# Patient Record
Sex: Female | Born: 1965 | Race: White | Hispanic: No | Marital: Single | Smoking: Former smoker
Health system: Southern US, Community
[De-identification: ages and names within clinical notes are randomized; demographics above are authoritative.]

## PROBLEM LIST (undated history)

## (undated) DIAGNOSIS — T7840XA Allergy, unspecified, initial encounter: Secondary | ICD-10-CM

## (undated) DIAGNOSIS — E785 Hyperlipidemia, unspecified: Secondary | ICD-10-CM

## (undated) DIAGNOSIS — I10 Essential (primary) hypertension: Secondary | ICD-10-CM

## (undated) DIAGNOSIS — M1990 Unspecified osteoarthritis, unspecified site: Secondary | ICD-10-CM

## (undated) DIAGNOSIS — K219 Gastro-esophageal reflux disease without esophagitis: Secondary | ICD-10-CM

## (undated) DIAGNOSIS — R519 Headache, unspecified: Secondary | ICD-10-CM

## (undated) DIAGNOSIS — U071 COVID-19: Secondary | ICD-10-CM

## (undated) DIAGNOSIS — F419 Anxiety disorder, unspecified: Secondary | ICD-10-CM

## (undated) DIAGNOSIS — E039 Hypothyroidism, unspecified: Secondary | ICD-10-CM

## (undated) DIAGNOSIS — R7303 Prediabetes: Secondary | ICD-10-CM

## (undated) DIAGNOSIS — I8390 Asymptomatic varicose veins of unspecified lower extremity: Secondary | ICD-10-CM

## (undated) HISTORY — PX: TUBAL LIGATION: SHX77

## (undated) HISTORY — PX: CHOLECYSTECTOMY: SHX55

## (undated) HISTORY — DX: Allergy, unspecified, initial encounter: T78.40XA

## (undated) HISTORY — DX: Hyperlipidemia, unspecified: E78.5

## (undated) HISTORY — DX: Asymptomatic varicose veins of unspecified lower extremity: I83.90

---

## 1999-03-11 ENCOUNTER — Other Ambulatory Visit: Admission: RE | Admit: 1999-03-11 | Discharge: 1999-03-11 | Payer: Self-pay | Admitting: Family Medicine

## 2010-10-14 ENCOUNTER — Emergency Department (HOSPITAL_COMMUNITY)
Admission: EM | Admit: 2010-10-14 | Discharge: 2010-10-14 | Payer: Self-pay | Source: Home / Self Care | Admitting: Emergency Medicine

## 2012-04-24 ENCOUNTER — Encounter: Payer: Self-pay | Admitting: Family Medicine

## 2012-04-24 ENCOUNTER — Ambulatory Visit (INDEPENDENT_AMBULATORY_CARE_PROVIDER_SITE_OTHER): Payer: 59 | Admitting: Family Medicine

## 2012-04-24 VITALS — BP 112/80 | HR 60 | Resp 16 | Ht 64.0 in | Wt 221.4 lb

## 2012-04-24 DIAGNOSIS — R0602 Shortness of breath: Secondary | ICD-10-CM

## 2012-04-24 DIAGNOSIS — E669 Obesity, unspecified: Secondary | ICD-10-CM

## 2012-04-24 DIAGNOSIS — M25569 Pain in unspecified knee: Secondary | ICD-10-CM

## 2012-04-24 DIAGNOSIS — Z720 Tobacco use: Secondary | ICD-10-CM

## 2012-04-24 DIAGNOSIS — M7989 Other specified soft tissue disorders: Secondary | ICD-10-CM

## 2012-04-24 DIAGNOSIS — M549 Dorsalgia, unspecified: Secondary | ICD-10-CM | POA: Insufficient documentation

## 2012-04-24 DIAGNOSIS — F17200 Nicotine dependence, unspecified, uncomplicated: Secondary | ICD-10-CM

## 2012-04-24 DIAGNOSIS — J309 Allergic rhinitis, unspecified: Secondary | ICD-10-CM

## 2012-04-24 DIAGNOSIS — K219 Gastro-esophageal reflux disease without esophagitis: Secondary | ICD-10-CM | POA: Insufficient documentation

## 2012-04-24 DIAGNOSIS — J302 Other seasonal allergic rhinitis: Secondary | ICD-10-CM

## 2012-04-24 LAB — COMPREHENSIVE METABOLIC PANEL
AST: 16 U/L (ref 0–37)
Albumin: 4.1 g/dL (ref 3.5–5.2)
Alkaline Phosphatase: 51 U/L (ref 39–117)
CO2: 29 mEq/L (ref 19–32)
Calcium: 9.1 mg/dL (ref 8.4–10.5)
Creat: 0.66 mg/dL (ref 0.50–1.10)
Potassium: 4.4 mEq/L (ref 3.5–5.3)

## 2012-04-24 LAB — CBC WITH DIFFERENTIAL/PLATELET
Basophils Absolute: 0 10*3/uL (ref 0.0–0.1)
Basophils Relative: 0 % (ref 0–1)
Hemoglobin: 12 g/dL (ref 12.0–15.0)
MCH: 29.3 pg (ref 26.0–34.0)
MCHC: 33.4 g/dL (ref 30.0–36.0)
Monocytes Absolute: 0.4 10*3/uL (ref 0.1–1.0)
Neutro Abs: 2.4 10*3/uL (ref 1.7–7.7)
Neutrophils Relative %: 50 % (ref 43–77)
WBC: 5 10*3/uL (ref 4.0–10.5)

## 2012-04-24 LAB — LIPID PANEL
Cholesterol: 231 mg/dL — ABNORMAL HIGH (ref 0–200)
HDL: 48 mg/dL (ref >39)
Total CHOL/HDL Ratio: 4.8 Ratio
Triglycerides: 224 mg/dL — ABNORMAL HIGH (ref <150)

## 2012-04-24 MED ORDER — RANITIDINE HCL 150 MG PO TABS: 150.0000 mg | ORAL_TABLET | Freq: Every day | ORAL | Status: DC

## 2012-04-24 NOTE — Patient Instructions (Signed)
Try the zantac at bedtime Get the labs done we will call with results Try the zyrtec Elevate legs F/U 6 weeks for physical exam/PAP Smear Congratulations on the smoking

## 2012-04-24 NOTE — Progress Notes (Signed)
  Subjective:    Patient ID: Hannah Allen, female    DOB: September 17, 1966, 46 y.o.   MRN: [id number]  HPI Patient presents to establish care. No previous primary care provider. She has not seen a physician in many years. She does follow with the chiropractic here in town for her back pain. 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 She has no past medical history besides the back pain. Today she is concerned about leg swelling which is increased of the past few weeks she recently started walking and has had some left knee pain as well. She has gained 22 pounds since she quit smoking 6 months ago. She's never had a mammogram.  When she began walking she felt short of breath and felt like she couldn't breathe out of her nose and she may faint.  She was evaluated at [facility] a few years ago for chest pain and was admitted overnight with testing. All of this was negative and she was told she has acid reflux. She continues to have problems with her acid reflux however does not take any medications.   Review of Systems - per above   GEN- denies fatigue, fever, weight loss,weakness, recent illness HEENT- denies eye drainage, change in vision, nasal discharge, +sore throat on and off CVS- denies chest pain, palpitations RESP- + SOB, cough, wheeze ABD- denies N/V, change in stools, abd pain GU- denies dysuria, hematuria, dribbling, incontinence MSK- + joint pain, muscle aches, injury Neuro- denies headache, dizziness, syncope, seizure activity      Objective:   Physical Exam GEN- NAD, alert and oriented x3 HEENT- PERRL, EOMI, non injected sclera, pink conjunctiva, MMM, oropharynx clear, TM Clear bilat, uvula hangs toward right, no exudates, turbinates inflamed clear mucous seen, no sinus tenderness Neck- Supple, no thryomegaly CVS- RRR, no murmur RESP-CTAB ABD-NABS,soft, NT,ND EXT- +pedal  Edema, varicose veins, spider veins    Right knee- normal inspection, normal ROM, left  knee no appreciable effusion,no boney abnormality, mild anterior swelling below the patella- varicose vein seen here, NT, normal ROM Pulses- Radial, DP- 2+ Psych- normal affect and Mood   EKG-  NSR, no ST changes         Assessment & Plan:

## 2012-04-24 NOTE — Assessment & Plan Note (Signed)
Trial of zantac at bedtime 

## 2012-04-24 NOTE — Assessment & Plan Note (Signed)
This is likely due to her obesity and decreased conditioning. Her EKG was reassuring. Lab work to be done. I will consider echocardiogram however she does not have very much edema today. She seems to be very anxious regarding this. Reassure her she can continue to walk

## 2012-04-24 NOTE — Assessment & Plan Note (Signed)
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

## 2012-04-24 NOTE — Assessment & Plan Note (Signed)
She has some mild pedal edema she also has varicose veins should also be contributing to her swelling when they burst such as the one below her knee. She has TED hose available she can wear these while at work she also has appropriate shoes. Advised to elevate feet when she is not working. This does not appear to be cardiac at this time.

## 2012-04-24 NOTE — Assessment & Plan Note (Signed)
Discuss her exercise. She started out trying to do 3 miles at a time. Advised her to cut back and she is getting symptomatic with shortness of breath and feeling lightheaded and fatigued. I think she is overdue and initially. She does need to work on weight loss since she has quit smoking and has increased her snacking

## 2012-04-24 NOTE — Assessment & Plan Note (Signed)
Trial of OTC allergy med for runny nose and occasional sore throat

## 2012-04-24 NOTE — Assessment & Plan Note (Signed)
Acute left knee pain since she began walking. The knee appears to be intact. Her weight is also contributing to pain.

## 2012-04-25 ENCOUNTER — Encounter: Payer: Self-pay | Admitting: Family Medicine

## 2012-04-25 DIAGNOSIS — E782 Mixed hyperlipidemia: Secondary | ICD-10-CM | POA: Insufficient documentation

## 2012-05-01 ENCOUNTER — Ambulatory Visit (HOSPITAL_COMMUNITY)
Admission: RE | Admit: 2012-05-01 | Discharge: 2012-05-01 | Disposition: A | Discharge status: Home / Self Care | Payer: 59 | Source: Ambulatory Visit | Attending: Family Medicine | Admitting: Family Medicine

## 2012-05-01 ENCOUNTER — Encounter: Payer: Self-pay | Admitting: Family Medicine

## 2012-05-01 ENCOUNTER — Ambulatory Visit (INDEPENDENT_AMBULATORY_CARE_PROVIDER_SITE_OTHER): Payer: 59 | Admitting: Family Medicine

## 2012-05-01 VITALS — BP 118/76 | HR 75 | Resp 18 | Ht 64.0 in | Wt 219.1 lb

## 2012-05-01 DIAGNOSIS — M25473 Effusion, unspecified ankle: Secondary | ICD-10-CM | POA: Insufficient documentation

## 2012-05-01 DIAGNOSIS — M25569 Pain in unspecified knee: Secondary | ICD-10-CM | POA: Insufficient documentation

## 2012-05-01 DIAGNOSIS — M7989 Other specified soft tissue disorders: Secondary | ICD-10-CM

## 2012-05-01 DIAGNOSIS — M549 Dorsalgia, unspecified: Secondary | ICD-10-CM

## 2012-05-01 DIAGNOSIS — M25476 Effusion, unspecified foot: Secondary | ICD-10-CM | POA: Insufficient documentation

## 2012-05-01 DIAGNOSIS — M25579 Pain in unspecified ankle and joints of unspecified foot: Secondary | ICD-10-CM | POA: Insufficient documentation

## 2012-05-01 MED ORDER — FUROSEMIDE 20 MG PO TABS: 20.0000 mg | ORAL_TABLET | Freq: Every day | ORAL | Status: DC

## 2012-05-01 MED ORDER — MELOXICAM 7.5 MG PO TABS: 7.5000 mg | ORAL_TABLET | Freq: Every day | ORAL | Status: DC

## 2012-05-01 MED ORDER — TRAMADOL HCL 50 MG PO TABS: 50.0000 mg | ORAL_TABLET | Freq: Four times a day (QID) | ORAL | Status: DC | PRN

## 2012-05-01 NOTE — Patient Instructions (Addendum)
Get the xrays done of your back, knee and ankle Take the anti-inflammatory once a day Use the ultram as needed Try an ASO brace for the ankle Lasix sent for swelling as needed  Keep previous f/u appt

## 2012-05-02 DIAGNOSIS — M25579 Pain in unspecified ankle and joints of unspecified foot: Secondary | ICD-10-CM | POA: Insufficient documentation

## 2012-05-02 NOTE — Assessment & Plan Note (Signed)
Will obtain x-ray of knee, I think the inferior patella swelling is still related to varicose vein.  X-ray of knee normal with exception of mild narrowing

## 2012-05-02 NOTE — Progress Notes (Signed)
  Subjective:    Patient ID: Hannah Allen, female    DOB: 1966/08/02, 46 y.o.   MRN: [id number]  HPI Pt here with increased leg swelling and ankle, back and knee pain. She worked as Scientist, clinical (histocompatibility and immunogenetics) and was up on feet entire weekend, her left ankle began to swell and is painful to walk on. She also has more pain in left knee. Using advil but not helping with pain. No injury   Review of Systems   GEN- denies fatigue, fever, weight loss,weakness, recent illness CVS- denies chest pain, palpitations RESP- denies SOB, cough, wheeze MSK- + joint pain, muscle aches, injury Neuro- denies headache, dizziness, syncope, seizure activity      Objective:   Physical Exam GEN-NAD, alert and oriented x 3 CVS-RRR, no murmur, No JVD RESP- CTAB EXT- +pedal  Edema   L > R, varicose veins, spider veins    Left knee- , normal ROM, swelling below patella, mild TTP, left knee no appreciable effusion- varicose vein seen here,  Left Ankle- decreased ROM with swelling, TTP lateral malleous, ligaments in tact, able to bear weight Pulses- Radial, DP- 2+ Back- spine NT, neg SLR,  Neuro-motor equal bilat lower ext, sensation in tact      Assessment & Plan:

## 2012-05-02 NOTE — Assessment & Plan Note (Addendum)
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

## 2012-05-02 NOTE — Assessment & Plan Note (Signed)
Left ankle pain acute in nature, x-ray obtained which was negative for bony pathology but swelling noted, will have her wear ASO for stability as she feels uncomfortable, per above for swelling

## 2012-05-02 NOTE — Assessment & Plan Note (Signed)
Swelling more on side of varicose veins, but significantly more than previous exam, elevate feet, lasix given as she states they swell even more to be used as needed, consider vascular and vein referral

## 2012-05-07 ENCOUNTER — Telehealth: Payer: Self-pay | Admitting: Family Medicine

## 2012-05-07 NOTE — Telephone Encounter (Signed)
Called back with results. No answer

## 2012-05-07 NOTE — Telephone Encounter (Signed)
Pt aware.

## 2012-05-31 ENCOUNTER — Encounter: Payer: Self-pay | Admitting: Family Medicine

## 2012-06-02 ENCOUNTER — Encounter (HOSPITAL_COMMUNITY): Payer: Self-pay | Admitting: *Deleted

## 2012-06-02 ENCOUNTER — Emergency Department (HOSPITAL_COMMUNITY): Payer: 59

## 2012-06-02 ENCOUNTER — Emergency Department (HOSPITAL_COMMUNITY)
Admission: EM | Admit: 2012-06-02 | Discharge: 2012-06-03 | Disposition: A | Discharge status: Home / Self Care | Payer: 59 | Attending: Emergency Medicine | Admitting: Emergency Medicine

## 2012-06-02 DIAGNOSIS — Z87891 Personal history of nicotine dependence: Secondary | ICD-10-CM | POA: Insufficient documentation

## 2012-06-02 DIAGNOSIS — F411 Generalized anxiety disorder: Secondary | ICD-10-CM | POA: Insufficient documentation

## 2012-06-02 DIAGNOSIS — F419 Anxiety disorder, unspecified: Secondary | ICD-10-CM

## 2012-06-02 HISTORY — DX: Anxiety disorder, unspecified: F41.9

## 2012-06-02 LAB — CBC WITH DIFFERENTIAL/PLATELET
Basophils Relative: 1 % (ref 0–1)
Lymphocytes Relative: 45 % (ref 12–46)
Lymphs Abs: 2.8 10*3/uL (ref 0.7–4.0)
MCH: 29.1 pg (ref 26.0–34.0)
Monocytes Absolute: 0.5 10*3/uL (ref 0.1–1.0)
Monocytes Relative: 8 % (ref 3–12)
Neutrophils Relative %: 44 % (ref 43–77)
Platelets: 246 10*3/uL (ref 150–400)
RBC: 4.12 MIL/uL (ref 3.87–5.11)
WBC: 6.2 10*3/uL (ref 4.0–10.5)

## 2012-06-02 LAB — D-DIMER, QUANTITATIVE: D-Dimer, Quant: 0.43 ug/mL-FEU (ref 0.00–0.48)

## 2012-06-02 LAB — BASIC METABOLIC PANEL
BUN: 21 mg/dL (ref 6–23)
CO2: 27 mEq/L (ref 19–32)
Chloride: 100 mEq/L (ref 96–112)
GFR calc Af Amer: >90 mL/min (ref >90)
Glucose, Bld: 92 mg/dL (ref 70–99)

## 2012-06-02 LAB — TROPONIN I: Troponin I: <0.3 ng/mL (ref <0.30)

## 2012-06-02 NOTE — ED Notes (Signed)
Patient aware of need for urine specimen  - unable to void at present

## 2012-06-02 NOTE — ED Provider Notes (Signed)
History   This chart was scribed for [employer]. Rubin Payor, MD by Charolett Bumpers . The patient was seen in room APA12/APA12. Patient's care was started at 2034.    CSN: [id number]  Arrival date & time 06/02/12  2019   First MD Initiated Contact with Patient 06/02/12 2034      Chief Complaint  Patient presents with  . Panic Attack    (Consider location/radiation/quality/duration/timing/severity/associated sxs/prior treatment) HPI Hannah Allen is a 46 y.o. female who presents to the Emergency Department complaining of constant, moderate chest pain with an onset of earlier today approximately 2 hours PTA. Pt reports associated dizziness, SOB, left leg swelling, and cough. Pt describes her chest pain as pressure and states it feels "heavy". Pt reports a h/o similar symptoms and states that today's symptoms feel similar. Pt reports a h/o anxiety and panic attacks. Pt denies any fevers, n/v, dysuria, or increased frequency. Pt reports 6 months of smoking cessation and recent weight gain. Pt denies any drug abuse. Pt denies any h/o DVT or heart problems.   Past Medical History  Diagnosis Date  . Anxiety     Past Surgical History  Procedure Date  . Cholecystectomy   . Tubal ligation     Family History  Problem Relation Age of Onset  . Heart disease Maternal Grandmother   . Diabetes Maternal Grandmother     History  Substance Use Topics  . Smoking status: Former Games developer  . Smokeless tobacco: Not on file   Comment: quit 6 months  . Alcohol Use: No    OB History    Grav Para Term Preterm Abortions TAB SAB Ect Mult Living                  Review of Systems  Constitutional: Negative for fever.  Respiratory: Positive for cough and shortness of breath.   Cardiovascular: Positive for chest pain and leg swelling.  Gastrointestinal: Negative for nausea and vomiting.  Genitourinary: Negative for dysuria and frequency.  Neurological: Positive for dizziness.  All other  systems reviewed and are negative.    Allergies  Review of patient's allergies indicates no known allergies.  Home Medications   Current Outpatient Rx  Name Route Sig Dispense Refill  . ASPIRIN EC 81 MG PO TBEC Oral Take 81 mg by mouth daily.    . FUROSEMIDE 20 MG PO TABS Oral Take 20 mg by mouth daily as needed. For fluid retention    . IBUPROFEN 200 MG PO TABS Oral Take 800 mg by mouth as needed.    Marland Kitchen LORATADINE 10 MG PO TABS Oral Take 10 mg by mouth daily.    Marland Kitchen FISH OIL 1000 MG PO CAPS Oral Take 1,000 mg by mouth 2 (two) times daily.     Marland Kitchen RANITIDINE HCL 150 MG PO TABS Oral Take 150 mg by mouth daily.    . TRAMADOL HCL 50 MG PO TABS Oral Take 1 tablet (50 mg total) by mouth every 6 (six) hours as needed for pain. 30 tablet 1    BP 146/78  Pulse 61  Temp 98.4 F (36.9 C) (Oral)  Resp 18  Ht 5\' 4"  (1.626 m)  Wt 226 lb (102.513 kg)  BMI 38.79 kg/m2  SpO2 100%  Physical Exam  Nursing note and vitals reviewed. Constitutional: She is oriented to person, place, and time. She appears well-developed and well-nourished. No distress.  HENT:  Head: Normocephalic and atraumatic.  Eyes: EOM are normal. Pupils are equal, round, and  reactive to light.  Neck: Neck supple. No tracheal deviation present.  Cardiovascular: Normal rate and normal heart sounds.   No murmur heard. Pulmonary/Chest: Effort normal and breath sounds normal. No respiratory distress. She has no wheezes.  Abdominal: Soft. Bowel sounds are normal. She exhibits no distension. There is no tenderness. There is no rebound and no guarding.  Musculoskeletal: Normal range of motion. She exhibits edema.       Mild edema of lower extremities, worse on the left.   Neurological: She is alert and oriented to person, place, and time.  Skin: Skin is warm and dry.       Varicose veins on left leg.   Psychiatric: She has a normal mood and affect. Her behavior is normal.    ED Course  Procedures (including critical care  time)  DIAGNOSTIC STUDIES: Oxygen Saturation is 99% on room air, normal by my interpretation.    COORDINATION OF CARE:  20:40-Discussed planned course of treatment with the patient, who is agreeable at this time.   Results for orders placed during the hospital encounter of 06/02/12  CBC WITH DIFFERENTIAL      Component Value Range   WBC 6.2  4.0 - 10.5 K/uL   RBC 4.12  3.87 - 5.11 MIL/uL   Hemoglobin 12.0  12.0 - 15.0 g/dL   HCT 16.1  09.6 - 04.5 %   MCV 87.9  78.0 - 100.0 fL   MCH 29.1  26.0 - 34.0 pg   MCHC 33.1  30.0 - 36.0 g/dL   RDW 40.9  81.1 - 91.4 %   Platelets 246  150 - 400 K/uL   Neutrophils Relative 44  43 - 77 %   Neutro Abs 2.7  1.7 - 7.7 K/uL   Lymphocytes Relative 45  12 - 46 %   Lymphs Abs 2.8  0.7 - 4.0 K/uL   Monocytes Relative 8  3 - 12 %   Monocytes Absolute 0.5  0.1 - 1.0 K/uL   Eosinophils Relative 2  0 - 5 %   Eosinophils Absolute 0.1  0.0 - 0.7 K/uL   Basophils Relative 1  0 - 1 %   Basophils Absolute 0.0  0.0 - 0.1 K/uL  TROPONIN I      Component Value Range   Troponin I <0.30  <0.30 ng/mL  BASIC METABOLIC PANEL      Component Value Range   Sodium 135  135 - 145 mEq/L   Potassium 3.8  3.5 - 5.1 mEq/L   Chloride 100  96 - 112 mEq/L   CO2 27  19 - 32 mEq/L   Glucose, Bld 92  70 - 99 mg/dL   BUN 21  6 - 23 mg/dL   Creatinine, Ser 7.82  0.50 - 1.10 mg/dL   Calcium 9.8  8.4 - 95.6 mg/dL   GFR calc non Af Amer >90  >90 mL/min   GFR calc Af Amer >90  >90 mL/min  D-DIMER, QUANTITATIVE      Component Value Range   D-Dimer, Quant 0.43  0.00 - 0.48 ug/mL-FEU   Dg Chest 2 View  06/02/2012  *RADIOLOGY REPORT*  Clinical Data: 46 year old female with chest pain.  CHEST - 2 VIEW  Comparison: None  Findings: The cardiomediastinal silhouette is unremarkable. There is no evidence of focal airspace disease, pulmonary edema, suspicious pulmonary nodule/mass, pleural effusion, or pneumothorax. No acute bony abnormalities are identified.  IMPRESSION: No evidence  of active cardiopulmonary disease.  Original Report Authenticated By: JEFFREY  Brand Males, M.D.      1. Anxiety      Date: 06/02/2012  Rate: 60  Rhythm: normal sinus rhythm  QRS Axis: normal  Intervals: normal  ST/T Wave abnormalities: normal  Conduction Disutrbances: none  Narrative Interpretation: unremarkable      MDM  Patient presents with dizziness shortness of breath and chest heaviness. She has a history of panic attacks and thinks this could of been one of them. EKG is normal. Lab work is reassuring. She does have one leg more swollen than the other, however she has a negative d-dimer. Patient will be discharged home to followup with her primary care Dr. I doubt severe cardiac cause of the symptoms.    I personally performed the services described in this documentation, which was scribed in my presence. The recorded information has been reviewed and considered.       Juliet Rude. Rubin Payor, MD 06/02/12 2351

## 2012-06-02 NOTE — ED Notes (Addendum)
Pt reports getting dizzy, and having some SOB earlier, states chest is feeling "heavy". Reports symptoms began a little over 2 hours ago. Denies any nausea.  Reports that she does have history of panic attacks, and believes she may be having one.

## 2012-06-07 ENCOUNTER — Ambulatory Visit (INDEPENDENT_AMBULATORY_CARE_PROVIDER_SITE_OTHER): Payer: 59 | Admitting: Family Medicine

## 2012-06-07 ENCOUNTER — Encounter: Payer: Self-pay | Admitting: Family Medicine

## 2012-06-07 VITALS — BP 140/82 | HR 72 | Resp 16 | Ht 64.0 in | Wt 219.0 lb

## 2012-06-07 DIAGNOSIS — I8390 Asymptomatic varicose veins of unspecified lower extremity: Secondary | ICD-10-CM

## 2012-06-07 DIAGNOSIS — M7989 Other specified soft tissue disorders: Secondary | ICD-10-CM

## 2012-06-07 DIAGNOSIS — F419 Anxiety disorder, unspecified: Secondary | ICD-10-CM

## 2012-06-07 DIAGNOSIS — J329 Chronic sinusitis, unspecified: Secondary | ICD-10-CM

## 2012-06-07 DIAGNOSIS — IMO0001 Reserved for inherently not codable concepts without codable children: Secondary | ICD-10-CM

## 2012-06-07 DIAGNOSIS — F411 Generalized anxiety disorder: Secondary | ICD-10-CM

## 2012-06-07 DIAGNOSIS — R030 Elevated blood-pressure reading, without diagnosis of hypertension: Secondary | ICD-10-CM

## 2012-06-07 MED ORDER — FLUTICASONE PROPIONATE 50 MCG/ACT NA SUSP: 2.0000 | Freq: Every day | NASAL | Status: DC

## 2012-06-07 MED ORDER — AMOXICILLIN-POT CLAVULANATE 875-125 MG PO TABS: 1.0000 | ORAL_TABLET | Freq: Two times a day (BID) | ORAL | Status: AC

## 2012-06-07 NOTE — Progress Notes (Signed)
  Subjective:    Patient ID: Hannah Allen, female    DOB: [birth year], 46 y.o.   MRN: [id number]  HPI Pt here to f/u ER visit for chest pain, she is also having persistent sore throat and feels sinus are clogged x 4 weeks. Feels something in her ears She is concerned about her leg swelling and veins on her legs  ER- D dimer, CE, CXR, EKG, labs- unremarkable, diagnosed with possible anxiety attack   Review of Systems - per above   GEN- denies fatigue, fever, weight loss,weakness, recent illness HEENT- denies eye drainage, change in vision,+ nasal discharge, CVS- denies chest pain, palpitations RESP- denies SOB, cough, wheeze ABD- denies N/V, change in stools, abd pain GU- denies dysuria, hematuria, dribbling, incontinence MSK- + joint pain, muscle aches, injury Neuro- denies headache, dizziness, syncope, seizure activity       Objective:   Physical Exam GEN-NAD, alert and oriented x 3  HEENT-PERRL EOMI, non icteric, pink conjunctiva, TM clear bialt no effusion, Left sinus tenderness, nares mild turbinate hypertrophy, injected oropharynx,  Neck- shotty anterior LAD CVS-RRR, no murmur,  RESP- CTAB EXT- +pedal  Edema   L > R, varicose veins, spider veins  Pulses- Radial, DP- 2+ Psych- anxious when discussing her multiple concerns, not overly depressed, no hallucinations       Assessment & Plan:

## 2012-06-07 NOTE — Patient Instructions (Signed)
You will be referred to vascular and vein specialist in [place] Take antibiotics as prescribed Nose spray given  Schedule for CPE with PAP Smear- End of August Schedule Mammogram- [facility]

## 2012-06-10 DIAGNOSIS — I8390 Asymptomatic varicose veins of unspecified lower extremity: Secondary | ICD-10-CM | POA: Insufficient documentation

## 2012-06-10 DIAGNOSIS — R030 Elevated blood-pressure reading, without diagnosis of hypertension: Secondary | ICD-10-CM | POA: Insufficient documentation

## 2012-06-10 DIAGNOSIS — F419 Anxiety disorder, unspecified: Secondary | ICD-10-CM | POA: Insufficient documentation

## 2012-06-10 DIAGNOSIS — J329 Chronic sinusitis, unspecified: Secondary | ICD-10-CM | POA: Insufficient documentation

## 2012-06-10 NOTE — Assessment & Plan Note (Signed)
She is anxious regarding her health though she had not sought care in many years, given reassurance, mild anxiety no meds at this time

## 2012-06-10 NOTE — Assessment & Plan Note (Signed)
Refer to vein and vascular, with leg swelling and pain L >R

## 2012-06-10 NOTE — Assessment & Plan Note (Signed)
Prolonged symptoms, treat with nasal spray and antibiotics

## 2012-06-10 NOTE — Assessment & Plan Note (Signed)
Will monitor for now

## 2012-06-14 ENCOUNTER — Other Ambulatory Visit: Payer: Self-pay

## 2012-06-14 DIAGNOSIS — M7989 Other specified soft tissue disorders: Secondary | ICD-10-CM

## 2012-06-29 ENCOUNTER — Ambulatory Visit (INDEPENDENT_AMBULATORY_CARE_PROVIDER_SITE_OTHER): Payer: 59 | Admitting: Family Medicine

## 2012-06-29 ENCOUNTER — Other Ambulatory Visit (HOSPITAL_COMMUNITY)
Admission: RE | Admit: 2012-06-29 | Discharge: 2012-06-29 | Disposition: A | Discharge status: Home / Self Care | Payer: 59 | Source: Ambulatory Visit | Attending: Family Medicine | Admitting: Family Medicine

## 2012-06-29 ENCOUNTER — Encounter: Payer: Self-pay | Admitting: Family Medicine

## 2012-06-29 VITALS — BP 140/76 | HR 70 | Resp 18 | Ht 64.0 in | Wt 220.0 lb

## 2012-06-29 DIAGNOSIS — Z01419 Encounter for gynecological examination (general) (routine) without abnormal findings: Secondary | ICD-10-CM | POA: Insufficient documentation

## 2012-06-29 DIAGNOSIS — E785 Hyperlipidemia, unspecified: Secondary | ICD-10-CM

## 2012-06-29 DIAGNOSIS — Z1211 Encounter for screening for malignant neoplasm of colon: Secondary | ICD-10-CM

## 2012-06-29 DIAGNOSIS — Z23 Encounter for immunization: Secondary | ICD-10-CM

## 2012-06-29 LAB — POC HEMOCCULT BLD/STL (OFFICE/1-CARD/DIAGNOSTIC): Fecal Occult Blood, POC: NEGATIVE

## 2012-06-29 NOTE — Progress Notes (Signed)
  Subjective:    Patient ID: Hannah Allen, female    DOB: 1965/11/13, 46 y.o.   MRN: [id number]  HPI Pt presents for GYN exam. No concerns Due for TDAP Last exam > 5 years ago  Due for Mammogram  Medications Reviewed   Review of Systems  GEN- denies fatigue, fever, weight loss,weakness, recent illness HEENT- denies eye drainage, change in vision, nasal discharge, CVS- denies chest pain, palpitations RESP- denies SOB, cough, wheeze ABD- denies N/V, change in stools, abd pain GU- denies dysuria, hematuria, dribbling, incontinence MSK- denies joint pain, muscle aches, injury Neuro- denies headache, dizziness, syncope, seizure activity      Objective:   Physical Exam GEN- NAD, alert and oriented x3 HEENT- PERRL, EOMI, non injected sclera, pink conjunctiva, MMM, oropharynx clear, TM clear bilat  Neck- supple, no LAD  CVS-RRR, no mumur RESP-CTAB ABD-NABS,soft,NT,ND  Breast- normal symmetry, no nipple inversion,no nipple drainage, no nodules or lumps felt Nodes- no axillary nodes GU- normal external genitalia, mild erythema over labia minora, vaginal mucosa pink and moist, cervix visualized no growth, no blood form os, minimal thin clear discharge, no CMT, adenexa not palpable, uterus normal size, urethra normal, no bladder prolapse Rectum- normal tone, soft stool, FOBT negative         Assessment & Plan:    GYN Exam- PAP Smear done, send for Mammogram          TDAP given

## 2012-06-29 NOTE — Patient Instructions (Addendum)
Call and schedule your Mammogram at [facility] Tetanus given today  I recommend eye visit once a year I recommend dental visit every 6 months Goal is to  Exercise 30 minutes 5 days a week We will send a letter with PAP results  Get the cholesterol rechecked in October - 1st week  F/U 6 months

## 2012-08-09 ENCOUNTER — Encounter: Payer: Self-pay | Admitting: Vascular Surgery

## 2012-08-10 ENCOUNTER — Encounter: Payer: Self-pay | Admitting: Vascular Surgery

## 2012-08-10 ENCOUNTER — Ambulatory Visit (INDEPENDENT_AMBULATORY_CARE_PROVIDER_SITE_OTHER): Payer: 59 | Admitting: Vascular Surgery

## 2012-08-10 ENCOUNTER — Encounter (INDEPENDENT_AMBULATORY_CARE_PROVIDER_SITE_OTHER): Payer: 59 | Admitting: *Deleted

## 2012-08-10 VITALS — BP 142/90 | HR 65 | Resp 18 | Ht 64.0 in | Wt 223.0 lb

## 2012-08-10 DIAGNOSIS — I83893 Varicose veins of bilateral lower extremities with other complications: Secondary | ICD-10-CM

## 2012-08-10 DIAGNOSIS — I872 Venous insufficiency (chronic) (peripheral): Secondary | ICD-10-CM

## 2012-08-10 NOTE — Progress Notes (Signed)
[facility] [place]  Referred by:  Salley Scarlet, MD [address] [place], [place] [address]  Reason for referral: Painful B leg  History of Present Illness  Hannah Allen is a 46 y.o. (08-25-1966) female who presents with chief complaint: L > R  leg.  Patient notes, onset of varicose veins in both legs years ago but increased pain in the L>R lower leg over the last few months, associated with extended standing.  The patient has had no history of DVT, prior pregnancies , known history of varicose vein, no history of venous stasis ulcers, no history of  Lymphedema and no history of skin changes in lower legs.  There is no family history of venous disorders.  The patient has never used compression stockings in the past.  Past Medical History  Diagnosis Date  . Anxiety   . Varicose veins   . Allergy   . Hyperlipidemia     Past Surgical History  Procedure Date  . Cholecystectomy   . Tubal ligation     History   Social History  . Marital Status: Single    Spouse Name: N/A    Number of Children: N/A  . Years of Education: N/A   Occupational History  . Not on file.   Social History Main Topics  . Smoking status: Former Smoker    Types: Cigarettes    Quit date: 08/11/2011  . Smokeless tobacco: Never Used   Comment: quit 6 months  . Alcohol Use: No  . Drug Use: No  . Sexually Active: Not on file   Other Topics Concern  . Not on file   Social History Narrative  . No narrative on file    Family History  Problem Relation Age of Onset  . Heart disease Maternal Grandmother   . Diabetes Maternal Grandmother     Current Outpatient Prescriptions on File Prior to Visit  Medication Sig Dispense Refill  . aspirin EC 81 MG tablet Take 81 mg by mouth daily.      . fluticasone (FLONASE) 50 MCG/ACT nasal spray Place 2 sprays into the nose daily.  16 g  2  . furosemide (LASIX) 20 MG tablet Take 20 mg by mouth daily as needed. For fluid retention       . ibuprofen (ADVIL,MOTRIN) 200 MG tablet Take 800 mg by mouth as needed.      . ranitidine (ZANTAC) 150 MG tablet Take 150 mg by mouth daily.      Marland Kitchen loratadine (CLARITIN) 10 MG tablet Take 10 mg by mouth daily.      . Omega-3 Fatty Acids (FISH OIL) 1000 MG CAPS Take 1,000 mg by mouth 2 (two) times daily.       . traMADol (ULTRAM) 50 MG tablet Take 1 tablet (50 mg total) by mouth every 6 (six) hours as needed for pain.  30 tablet  1    No Known Allergies  REVIEW OF SYSTEMS:  (Positives checked otherwise negative)  CARDIOVASCULAR:  [ ]  chest pain, [ ]  chest pressure, [ ]  palpitations, [ ]  shortness of breath when laying flat, [ ]  shortness of breath with exertion,   [x]  pain in feet when walking, [ ]  pain in feet when laying flat, [ ]  history of blood clot in veins (DVT), [ ]  history of phlebitis, [ ]  swelling in legs, [ ]  varicose veins  PULMONARY:  [ ]  productive cough, [ ]  asthma, [ ]  wheezing  NEUROLOGIC:  [x]   weakness in arms or legs, [x]  numbness in arms or legs, [ ]  difficulty speaking or slurred speech, [ ]  temporary loss of vision in one eye, [ ]  dizziness  HEMATOLOGIC:  [ ]  bleeding problems, [ ]  problems with blood clotting too easily  MUSCULOSKEL:  [ ]  joint pain, [ ]  joint swelling  GASTROINTEST:  [ ]   Vomiting blood, [ ]   Blood in stool     GENITOURINARY:  [ ]   Burning with urination, [ ]   Blood in urine  PSYCHIATRIC:  [ ]  history of major depression  INTEGUMENTARY:  [ ]  rashes, [ ]  ulcers  CONSTITUTIONAL:  [ ]  fever, [ ]  chills  Physical Examination  Filed Vitals:   08/10/12 1433  BP: 142/90  Pulse: 65  Resp: 18  Height: 5\' 4"  (1.626 m)  Weight: 223 lb (101.152 kg)   Body mass index is 38.28 kg/(m^2).  General: A&O x 3, WD, obese  Head: /AT  Ear/Nose/Throat: Hearing grossly intact, nares w/o erythema or drainage, oropharynx w/o Erythema/Exudate  Eyes: PERRLA, EOMI  Neck: Supple, no nuchal rigidity, no palpable LAD  Pulmonary: Sym exp, good air  movt, CTAB, no rales, rhonchi, & wheezing  Cardiac: RRR, Nl S1, S2, no Murmurs, rubs or gallops  Vascular: Vessel Right Left  Radial Palpable Palpable  Ulnar Palpable Palpable  Brachial Palpable Palpable  Carotid Palpable, without bruit Palpable, without bruit  Aorta Not palpable N/A  Femoral Palpable Palpable  Popliteal Not palpable Not palpable  PT Palpable Palpable  DP Palpable Palpable   Gastrointestinal: soft, NTND, -G/R, - HSM, - masses, - CVAT B  Musculoskeletal: M/S 5/5 throughout , Extremities without ischemic changes , B varicosities (L>R), no LDS, some spider veins L lower leg  Neurologic: CN 2-12 intact , Pain and light touch intact in extremities , Motor exam as listed above  Psychiatric: Judgment intact, Mood & affect appropriate for pt's clinical situation  Dermatologic: See M/S exam for extremity exam, no rashes otherwise noted  Lymph : No Cervical, Axillary, or Inguinal lymphadenopathy   Non-Invasive Vascular Imaging  BLE Venous Insufficiency Duplex (Date: 08/10/12):   RLE: no DVT or SVT, GSV reflux, femoral reflux  LLE:  no DVT or SVT, GSV reflux, femoral reflux  Outside Studies/Documentation 3 pages of outside documents were reviewed including: outpatient chart.  Medical Decision Making  Hannah Allen is a 46 y.o. female who presents with: BLE CVI (C2).   Based on the patient's history and examination, I recommend: BLE compression stockings (thigh high, 20-30 mm Hg) for 3 months.  Follow in Vein Clinic in 3 months for evaluation for LLE GSV EVLA followed by RLE GSV EVLA  Thank you for allowing [place] to participate in this patient's care.  Leonides Sake, MD [facility] [place] Office: [phone] Pager: [phone]  08/10/2012, 5:24 PM

## 2012-11-20 ENCOUNTER — Ambulatory Visit: Payer: 59 | Admitting: Vascular Surgery

## 2012-12-10 ENCOUNTER — Encounter: Payer: Self-pay | Admitting: Vascular Surgery

## 2012-12-11 ENCOUNTER — Encounter: Payer: Self-pay | Admitting: Vascular Surgery

## 2012-12-11 ENCOUNTER — Ambulatory Visit (INDEPENDENT_AMBULATORY_CARE_PROVIDER_SITE_OTHER): Payer: 59 | Admitting: Vascular Surgery

## 2012-12-11 VITALS — BP 141/82 | HR 72 | Resp 18 | Ht 64.0 in | Wt 226.4 lb

## 2012-12-11 DIAGNOSIS — I83893 Varicose veins of bilateral lower extremities with other complications: Secondary | ICD-10-CM

## 2012-12-11 NOTE — Progress Notes (Signed)
Problems with Activities of Daily Living Secondary to Leg Pain  1. 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  2. Ms. Andalon states that any activities that require prolonged standing (cooking, cleaning, shopping) are difficult due to leg pain.       Failure of  Conservative Therapy:  1. Worn 20-30 mm Hg thigh high compression hose >3 months with no relief of symptoms.  2. Frequently elevates legs-no relief of symptoms  3. Taken Ibuprofen 600 Mg TID with no relief of symptoms.  The patient continues to have significant pain despite elevation and compression garments. This is in the area of varicosities in her left medial calf. She reports this as achy burning stinging and severe itching symptoms. Physical exam is otherwise unchanged. She does have significant varicosities in this area does have a 2+ dorsalis pedis pulse on the left  I reimaged is very with SonoSite she does have enlarged a great saphenous vein extending into the varicosities in this area.  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 I have recommended great saphenous vein ablation and stab phlebectomy of tributary varicosities. I explained this is an outpatient procedure under local anesthetic. I explained very slight risk of deep venous injury with the procedure. I explained this is ambulatory under local anesthetic. The patient will continue to consider her desire to proceed with this we'll notify us if she would like to schedule

## 2013-01-26 ENCOUNTER — Encounter (HOSPITAL_COMMUNITY): Payer: Self-pay | Admitting: *Deleted

## 2013-01-26 ENCOUNTER — Emergency Department (HOSPITAL_COMMUNITY)
Admission: EM | Admit: 2013-01-26 | Discharge: 2013-01-26 | Disposition: A | Discharge status: Home / Self Care | Payer: BC Managed Care – PPO | Attending: Emergency Medicine | Admitting: Emergency Medicine

## 2013-01-26 DIAGNOSIS — R5381 Other malaise: Secondary | ICD-10-CM | POA: Insufficient documentation

## 2013-01-26 DIAGNOSIS — R110 Nausea: Secondary | ICD-10-CM | POA: Insufficient documentation

## 2013-01-26 DIAGNOSIS — F411 Generalized anxiety disorder: Secondary | ICD-10-CM | POA: Insufficient documentation

## 2013-01-26 DIAGNOSIS — H5710 Ocular pain, unspecified eye: Secondary | ICD-10-CM | POA: Insufficient documentation

## 2013-01-26 DIAGNOSIS — IMO0002 Reserved for concepts with insufficient information to code with codable children: Secondary | ICD-10-CM | POA: Insufficient documentation

## 2013-01-26 DIAGNOSIS — H9209 Otalgia, unspecified ear: Secondary | ICD-10-CM | POA: Insufficient documentation

## 2013-01-26 DIAGNOSIS — Z8679 Personal history of other diseases of the circulatory system: Secondary | ICD-10-CM | POA: Insufficient documentation

## 2013-01-26 DIAGNOSIS — E785 Hyperlipidemia, unspecified: Secondary | ICD-10-CM | POA: Insufficient documentation

## 2013-01-26 DIAGNOSIS — M542 Cervicalgia: Secondary | ICD-10-CM | POA: Insufficient documentation

## 2013-01-26 DIAGNOSIS — Z7982 Long term (current) use of aspirin: Secondary | ICD-10-CM | POA: Insufficient documentation

## 2013-01-26 DIAGNOSIS — Z87891 Personal history of nicotine dependence: Secondary | ICD-10-CM | POA: Insufficient documentation

## 2013-01-26 DIAGNOSIS — Z79899 Other long term (current) drug therapy: Secondary | ICD-10-CM | POA: Insufficient documentation

## 2013-01-26 DIAGNOSIS — R51 Headache: Secondary | ICD-10-CM

## 2013-01-26 LAB — CBC WITH DIFFERENTIAL/PLATELET
Eosinophils Absolute: 0.1 10*3/uL (ref 0.0–0.7)
Eosinophils Relative: 1 % (ref 0–5)
HCT: 38.2 % (ref 36.0–46.0)
Hemoglobin: 12.8 g/dL (ref 12.0–15.0)
Lymphs Abs: 2.6 10*3/uL (ref 0.7–4.0)
MCH: 29.8 pg (ref 26.0–34.0)
MCHC: 33.5 g/dL (ref 30.0–36.0)
Monocytes Absolute: 0.4 10*3/uL (ref 0.1–1.0)
Monocytes Relative: 7 % (ref 3–12)
RDW: 13.6 % (ref 11.5–15.5)

## 2013-01-26 LAB — BASIC METABOLIC PANEL
BUN: 23 mg/dL (ref 6–23)
Calcium: 9.6 mg/dL (ref 8.4–10.5)
Creatinine, Ser: 0.96 mg/dL (ref 0.50–1.10)
GFR calc non Af Amer: 69 mL/min — ABNORMAL LOW (ref >90)
Glucose, Bld: 96 mg/dL (ref 70–99)
Potassium: 4.2 mEq/L (ref 3.5–5.1)

## 2013-01-26 MED ORDER — IBUPROFEN 400 MG PO TABS
600.0000 mg | ORAL_TABLET | Freq: Once | ORAL | Status: DC
  Filled 2013-01-26: qty 2

## 2013-01-26 NOTE — ED Notes (Signed)
No answer in waiting room 

## 2013-01-26 NOTE — ED Provider Notes (Signed)
History  This chart was scribed for Raeford Razor, MD by Shari Heritage, ED Scribe. The patient was seen in room APA05/APA05. Patient's care was started at 1732.  CSN: [id number]  Arrival date & time 01/26/13  1557   First MD Initiated Contact with Patient 01/26/13 1732      Chief Complaint  Patient presents with  . Eye Problem  . Headache    Patient is a 47 y.o. female presenting with eye problem and headaches. The history is provided by the patient. No language interpreter was used.  Eye Problem Location:  L eye Duration:  1 day Timing:  Constant Progression:  Unchanged Chronicity:  New Context: not burn, not chemical exposure, not direct trauma and not foreign body   Associated symptoms: headaches and nausea   Headache Pain location:  Generalized Quality:  Dull Radiates to:  Does not radiate Onset quality:  Gradual Duration:  4 hours Timing:  Constant Progression:  Unchanged Chronicity:  New Associated symptoms: ear pain, fatigue, nausea and neck pain     HPI Comments: Hannah Allen is a 47 y.o. female who presents to the Emergency Department complaining of a constant, diffuse, non-radiating dull headache and left eye floaters that began earlier today. Patient stats that about 3-4 hour ago, she began having nausea and a gradual onset headache. She also states that today she began seeing small, squiggly lines across her left eye. She says that she has had eye floaters before, but this symptom occurs very sporadically. Other associated symptoms include neck "tightness," fatigue and aching ear pain that is worse on the left. She reports that she has had left eye twitching for the past week. Patient expresses concern over her blood pressure and states that BP at home prior to arrival was 159/89.  Past Medical History  Diagnosis Date  . Anxiety   . Varicose veins   . Allergy   . Hyperlipidemia     Past Surgical History  Procedure Laterality Date  . Cholecystectomy    .  Tubal ligation      Family History  Problem Relation Age of Onset  . Heart disease Maternal Grandmother   . Diabetes Maternal Grandmother     History  Substance Use Topics  . Smoking status: Former Smoker    Types: Cigarettes    Quit date: 08/11/2011  . Smokeless tobacco: Never Used     Comment: quit 6 months  . Alcohol Use: No    OB History   Grav Para Term Preterm Abortions TAB SAB Ect Mult Living                  Review of Systems  Constitutional: Positive for fatigue.  HENT: Positive for ear pain and neck pain.   Respiratory: Negative for shortness of breath.   Cardiovascular: Negative for chest pain.  Gastrointestinal: Positive for nausea.  Neurological: Positive for headaches.  All other systems reviewed and are negative.    Allergies  Review of patient's allergies indicates no known allergies.  Home Medications   Current Outpatient Rx  Name  Route  Sig  Dispense  Refill  . amoxicillin-clavulanate (AUGMENTIN) 875-125 MG per tablet               . aspirin EC 81 MG tablet   Oral   Take 81 mg by mouth daily.         . fluticasone (FLONASE) 50 MCG/ACT nasal spray   Nasal   Place 2 sprays into the nose daily.  16 g   2   . furosemide (LASIX) 20 MG tablet   Oral   Take 20 mg by mouth daily as needed. For fluid retention         . ibuprofen (ADVIL,MOTRIN) 200 MG tablet   Oral   Take 800 mg by mouth as needed.         . loratadine (CLARITIN) 10 MG tablet   Oral   Take 10 mg by mouth daily.         . Omega-3 Fatty Acids (FISH OIL) 1000 MG CAPS   Oral   Take 1,000 mg by mouth 2 (two) times daily.          . ranitidine (ZANTAC) 150 MG tablet   Oral   Take 150 mg by mouth daily.         . traMADol (ULTRAM) 50 MG tablet   Oral   Take 1 tablet (50 mg total) by mouth every 6 (six) hours as needed for pain.   30 tablet   1     Triage Vitals: BP 138/90  Pulse 70  Temp(Src) 97.7 F (36.5 C) (Oral)  Resp 18  Ht 5\' 4"  (1.626  m)  Wt 225 lb (102.059 kg)  BMI 38.6 kg/m2  SpO2 100%  Physical Exam  Nursing note and vitals reviewed. Constitutional: She appears well-developed and well-nourished. No distress.  HENT:  Head: Normocephalic and atraumatic.  Right Ear: Tympanic membrane is scarred.  Left Ear: Tympanic membrane normal.  Right TM appears scarred, but no evidence of acute process. No temporal tenderness. Temporal arteries palpable.   Eyes: Conjunctivae are normal. Right eye exhibits no discharge. Left eye exhibits no discharge.  Neck: Neck supple.  Cardiovascular: Normal rate, regular rhythm and normal heart sounds.  Exam reveals no gallop and no friction rub.   No murmur heard. Pulmonary/Chest: Effort normal and breath sounds normal. No respiratory distress.  Abdominal: Soft. She exhibits no distension. There is no tenderness.  Musculoskeletal: She exhibits no edema and no tenderness.  Neurological: She is alert.  Skin: Skin is warm and dry.  Psychiatric: She has a normal mood and affect. Her behavior is normal. Thought content normal.    ED Course  Procedures (including critical care time) DIAGNOSTIC STUDIES: Oxygen Saturation is 100% on room air, normal by my interpretation.    COORDINATION OF CARE: 5:53 PM- Patient informed of current plan for treatment and evaluation and agrees with plan at this time.   6:57 PM- Updated patient on labs which are unremarkable. Have ordered ibuprofen 600 mg. Discharge diagnosis: headache.   Labs Reviewed  BASIC METABOLIC PANEL - Abnormal; Notable for the following:    GFR calc non Af Amer 69 (*)    GFR calc Af Amer 80 (*)    All other components within normal limits  CBC WITH DIFFERENTIAL    No results found.   1. Headache       MDM  46yf with HA. Suspect primary HA. Consider emergent secondary causes such as bleed, infectious or mass but doubt. There is no history of trauma. Pt has a nonfocal neurological exam. Afebrile and neck supple. No use of  blood thinning medication. Consider ocular etiology such as acute angle closure glaucoma but doubt.  Doubt temporal arteritis given age, no temporal tenderness and temporal artery pulsations palpable. Doubt CO poisoning. No contacts with similar symptoms. Doubt venous thrombosis. Doubt carotid or vertebral arteries dissection. Symptoms improved with meds. Feel that can be safely discharged,  but strict return precautions discussed. Outpt fu.       I personally preformed the services scribed in my presence. The recorded information has been reviewed is accurate. Raeford Razor, MD.    Raeford Razor, MD 01/30/13 1726

## 2013-01-26 NOTE — ED Notes (Signed)
Pt c/o seeing "lines" in the vision of her left eye, fatigue, neck tightness for a week, nausea that started today, did take her blood pressure at home and was reading 159/89

## 2014-04-11 IMAGING — CR DG LUMBAR SPINE COMPLETE 4+V
5 series · 5 of 5 positions shown · non-contrast
Comparison: None

CLINICAL DATA: Low back pain, no known injury

LUMBAR SPINE - COMPLETE 4+ VIEW

[view not recorded (1 of 5)]
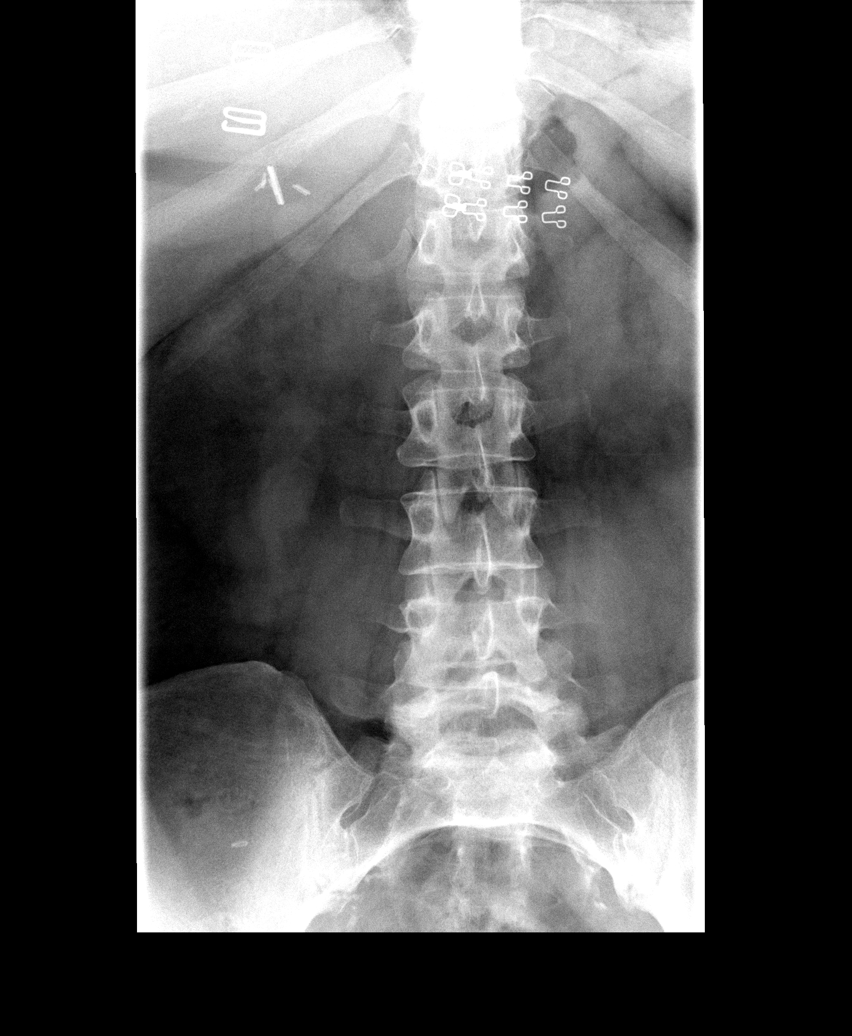

[view not recorded (2 of 5)]
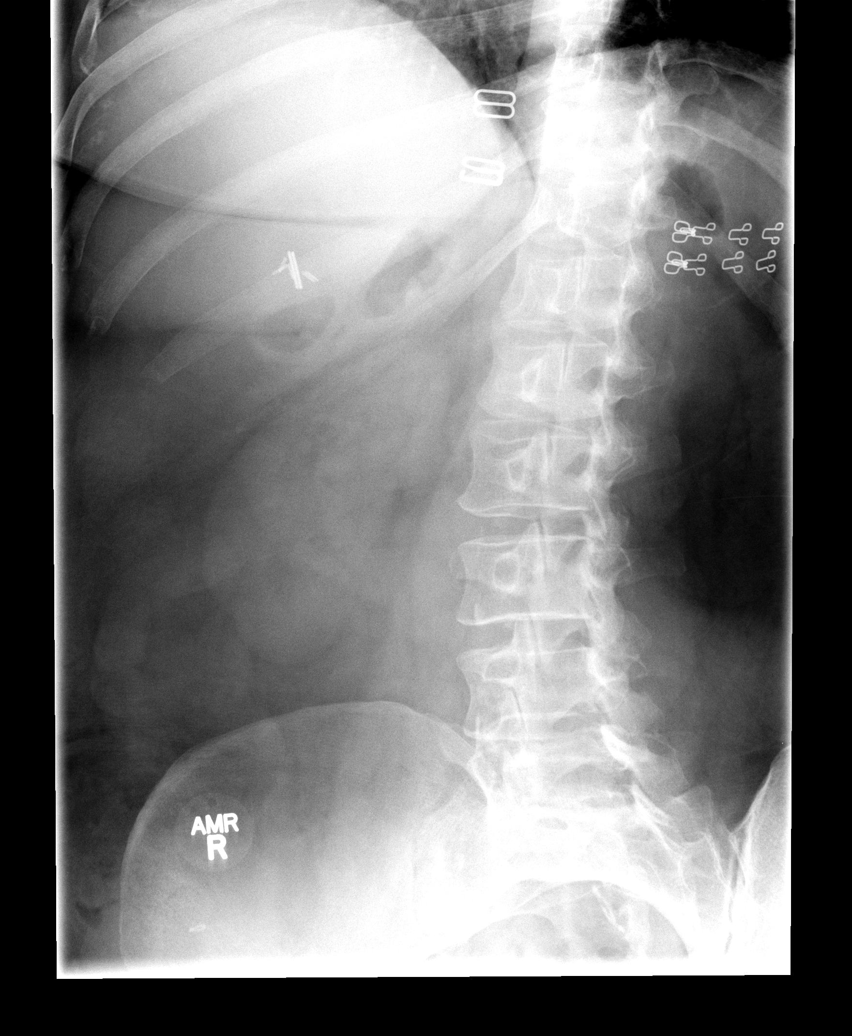

[view not recorded (3 of 5)]
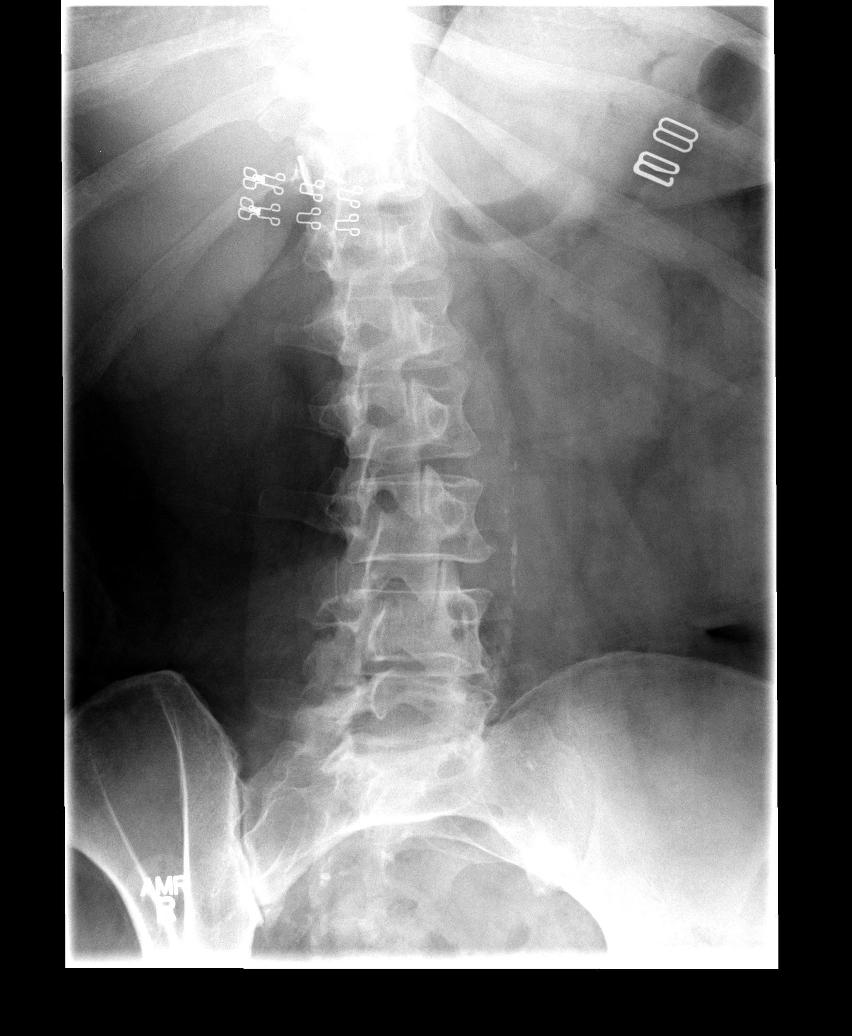

[view not recorded (4 of 5)]
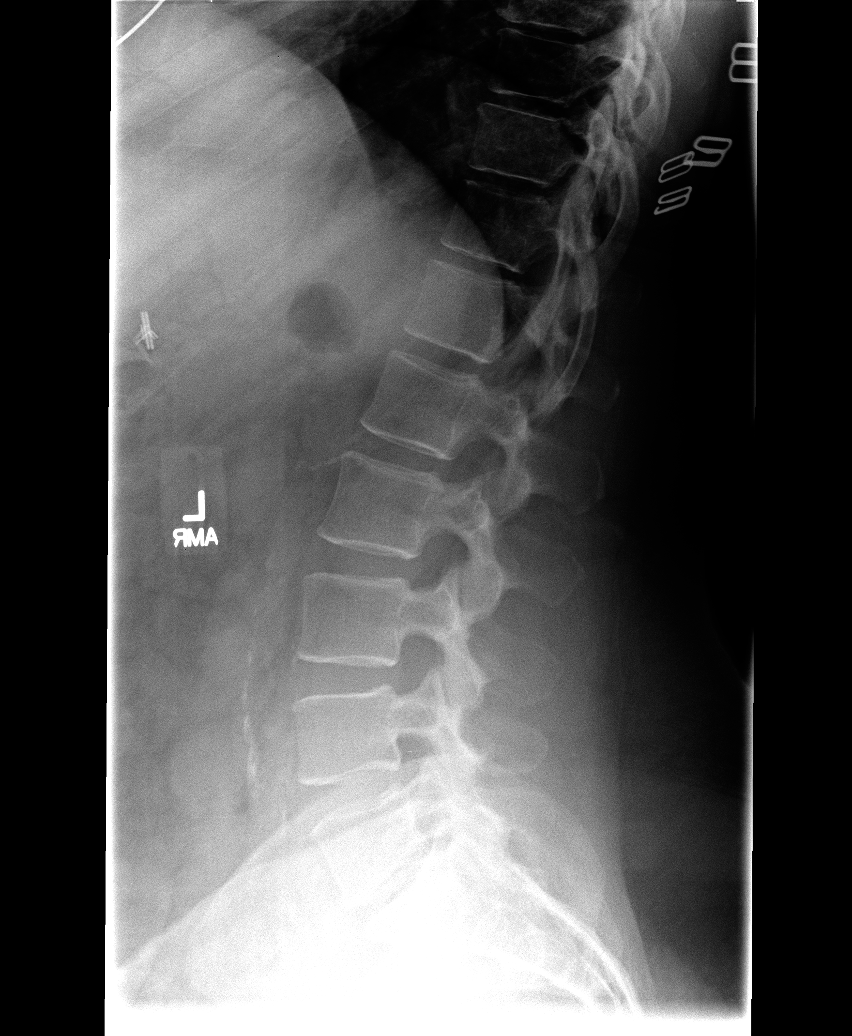

[view not recorded (5 of 5)]
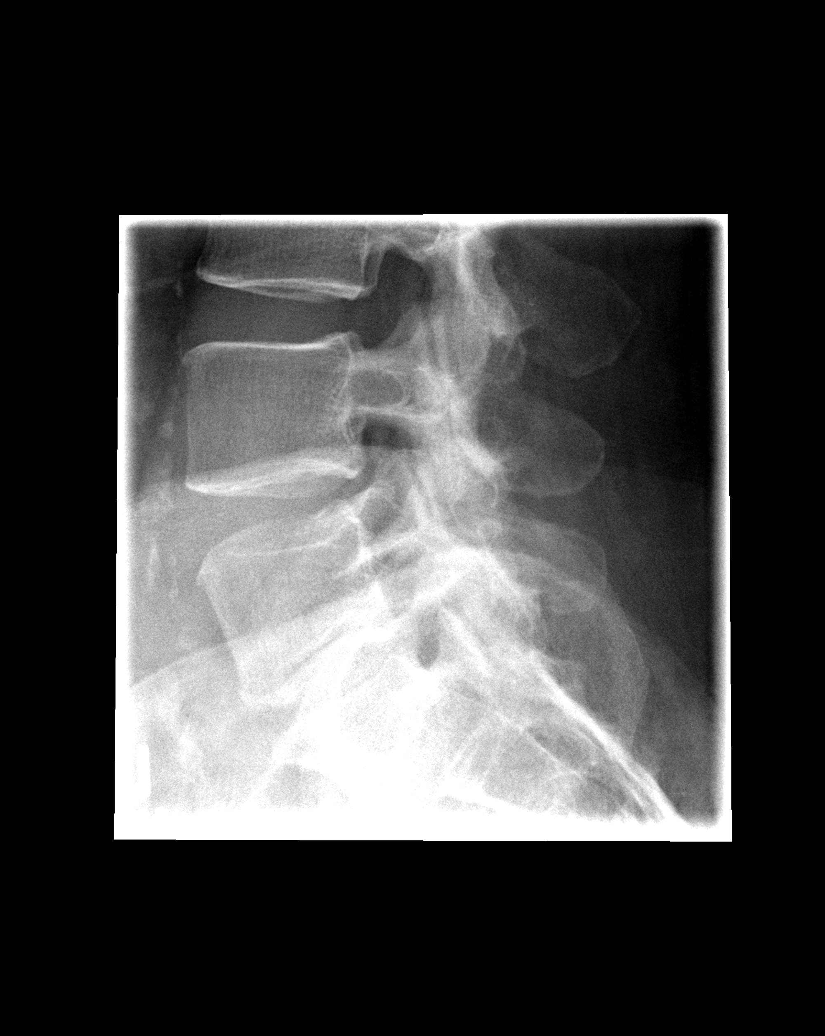

[5 of 5 positions shown; findings below may reference images not displayed]

FINDINGS: Hypoplastic last right ribs with question rudimentary left rib.
Five additional non-rib bearing lumbar type vertebrae.
Osseous mineralization grossly normal.
Vertebral body and disc space heights maintained.
No acute fracture, subluxation or bone destruction.
No spondylolysis.
Scattered atherosclerotic calcification.
Surgical clips right upper quadrant question cholecystectomy.
IMPRESSION: No acute abnormalities.

## 2016-04-07 ENCOUNTER — Encounter: Payer: Self-pay | Admitting: Physician Assistant

## 2016-04-07 ENCOUNTER — Ambulatory Visit (INDEPENDENT_AMBULATORY_CARE_PROVIDER_SITE_OTHER): Payer: Managed Care, Other (non HMO) | Admitting: Physician Assistant

## 2016-04-07 VITALS — BP 130/87 | HR 86 | Temp 98.4°F | Ht 64.0 in | Wt 233.6 lb

## 2016-04-07 DIAGNOSIS — J209 Acute bronchitis, unspecified: Secondary | ICD-10-CM | POA: Diagnosis not present

## 2016-04-07 MED ORDER — METHYLPREDNISOLONE ACETATE 80 MG/ML IJ SUSP
80.0000 mg | Freq: Once | INTRAMUSCULAR | Status: AC
  Administered 2016-04-07: 80 mg via INTRAMUSCULAR

## 2016-04-07 MED ORDER — AMOXICILLIN-POT CLAVULANATE 875-125 MG PO TABS: 1.0000 | ORAL_TABLET | Freq: Two times a day (BID) | ORAL | Status: DC

## 2016-04-07 NOTE — Progress Notes (Signed)
Subjective:     Patient ID: Hannah RatelKristina Allen, female   DOB: [birth year], 50 y.o.   MRN: [id number]  HPI Pt with cough and head congestion for 2 days + general malaise No smoking x 5 yrs Prior inhaler use but states it did not help  Review of Systems  Constitutional: Positive for activity change and fatigue. Negative for diaphoresis and appetite change.  HENT: Positive for congestion, ear pain, postnasal drip and sinus pressure. Negative for ear discharge and nosebleeds.   Respiratory: Positive for cough and wheezing. Negative for chest tightness and shortness of breath.   Cardiovascular: Negative.   Neurological: Positive for headaches.       Objective:   Physical Exam  Constitutional: She appears well-developed and well-nourished.  HENT:  Right Ear: External ear normal.  Left Ear: External ear normal.  Mouth/Throat: Oropharynx is clear and moist. No oropharyngeal exudate.  TM's with fluid bilat  Neck: Neck supple.  Cardiovascular: Normal rate, regular rhythm and normal heart sounds.   No murmur heard. Pulmonary/Chest: Effort normal. No respiratory distress. She has wheezes.  Lymphadenopathy:    She has no cervical adenopathy.  Nursing note and vitals reviewed.      Assessment:     1. Acute bronchitis, unspecified organism        Plan:     Augmentin 875mg  bid x 10 days DepoMedrol 80mg  Im today Fluids Rest OTC antihist/decongest F/U prn

## 2016-04-07 NOTE — Patient Instructions (Signed)

## 2016-04-07 NOTE — Addendum Note (Signed)
Addended by: Caryl BisBOWMAN, Geniene List M on: 04/07/2016 03:58 PM   Modules accepted: Kipp BroodSmartSet

## 2016-05-12 ENCOUNTER — Ambulatory Visit (INDEPENDENT_AMBULATORY_CARE_PROVIDER_SITE_OTHER): Payer: Managed Care, Other (non HMO) | Admitting: Pediatrics

## 2016-05-12 ENCOUNTER — Encounter: Payer: Self-pay | Admitting: Pediatrics

## 2016-05-12 VITALS — BP 147/86 | HR 69 | Temp 97.0°F | Ht 64.0 in | Wt 234.2 lb

## 2016-05-12 DIAGNOSIS — F419 Anxiety disorder, unspecified: Secondary | ICD-10-CM

## 2016-05-12 DIAGNOSIS — G4700 Insomnia, unspecified: Secondary | ICD-10-CM | POA: Diagnosis not present

## 2016-05-12 DIAGNOSIS — R030 Elevated blood-pressure reading, without diagnosis of hypertension: Secondary | ICD-10-CM | POA: Diagnosis not present

## 2016-05-12 DIAGNOSIS — F329 Major depressive disorder, single episode, unspecified: Secondary | ICD-10-CM

## 2016-05-12 DIAGNOSIS — IMO0001 Reserved for inherently not codable concepts without codable children: Secondary | ICD-10-CM

## 2016-05-12 DIAGNOSIS — F32A Depression, unspecified: Secondary | ICD-10-CM

## 2016-05-12 MED ORDER — CITALOPRAM HYDROBROMIDE 20 MG PO TABS: 20.0000 mg | ORAL_TABLET | Freq: Every day | ORAL | Status: DC

## 2016-05-12 MED ORDER — MELATONIN 10 MG PO TABS: 10.0000 mg | ORAL_TABLET | Freq: Every day | ORAL | Status: DC

## 2016-05-12 NOTE — Patient Instructions (Addendum)
Melatonin over the counter 30 min before bed Go to bed same time every night No TV within 2 hours of bed Wake up same time every morning  Citalopram 1/2 tab for 8 days then full tab (20mg )  Call counselor on list to set up appt

## 2016-05-12 NOTE — Progress Notes (Signed)
    Subjective:    Patient ID: Hannah Allen, female    DOB: 1966/01/10, 50 y.o.   MRN: [id number]  CC: Depression   HPI: Hannah RatelKristina Lachapelle is a 50 y.o. female presenting for Depression  Son killed 15 months ago in motorcycle accident Stressed since school and clinicals years ago Never been on medicines in the past Works in the [employer] x last 15 yrs Having a hard time sleeping now No thoughts of hurting herself Has three other children, also having a tough time Lives alone Says work has been fine, she knows she needs to take care of the people in her care Cries often at home  Pt interested in finding therapist to talk to  [id number]   Depression screen [facility] 2/9 05/12/2016 04/07/2016  Decreased Interest 3 0  Down, Depressed, Hopeless 3 0  PHQ - 2 Score 6 0  Altered sleeping 3 -  Tired, decreased energy 3 -  Change in appetite 2 -  Feeling bad or failure about yourself  3 -  Trouble concentrating 1 -  Moving slowly or fidgety/restless 0 -  Suicidal thoughts 0 -  PHQ-9 Score 18 -  Difficult doing work/chores Very difficult -     Relevant past medical, surgical, family and social history reviewed and updated as indicated.  Interim medical history since our last visit reviewed. Allergies and medications reviewed and updated.  ROS: Per HPI unless specifically indicated above  History  Smoking status  . Former Smoker  . Types: Cigarettes  . Quit date: 08/11/2011  Smokeless tobacco  . Never Used    Comment: quit 6 months       Objective:    BP 147/86 mmHg  Pulse 69  Temp(Src) 97 F (36.1 C) (Oral)  Ht 5\' 4"  (1.626 m)  Wt 234 lb 3.2 oz (106.232 kg)  BMI 40.18 kg/m2  Wt Readings from Last 3 Encounters:  05/12/16 234 lb 3.2 oz (106.232 kg)  04/07/16 233 lb 9.6 oz (105.96 kg)  01/26/13 225 lb (102.059 kg)     Gen: NAD, alert, cooperative with exam, NCAT EYES: EOMI, no scleral injection or icterus CV: NRRR, normal S1/S2, no murmur Resp: CTABL, no wheezes,  normal WOB Abd: +BS, soft, NTND. no guarding or organomegaly Ext: No edema, warm Neuro: Alert and oriented Psych: tearful at times, full affect, denies SI     Assessment & Plan:    Foye ClockKristina was seen today for depression.  Diagnoses and all orders for this visit:  Depression Gave list of counselors, strongly recommended she call one -     citalopram (CELEXA) 20 MG tablet; Take 1 tablet (20 mg total) by mouth daily.  Insomnia -     Melatonin 10 MG TABS; Take 10 mg by mouth at bedtime.  Anxiety  Elevated blood pressure Recheck next visit   Follow up plan: Return in about 4 weeks (around 06/09/2016) for follow up med problems.  Rex Krasarol [name], MD [facility] 05/12/2016, 2:39 PM

## 2016-08-23 ENCOUNTER — Encounter: Payer: Managed Care, Other (non HMO) | Admitting: *Deleted

## 2016-09-30 ENCOUNTER — Encounter: Payer: Self-pay | Admitting: Pediatrics

## 2016-09-30 ENCOUNTER — Ambulatory Visit (INDEPENDENT_AMBULATORY_CARE_PROVIDER_SITE_OTHER): Payer: Managed Care, Other (non HMO) | Admitting: Pediatrics

## 2016-09-30 VITALS — BP 132/80 | HR 65 | Temp 97.4°F | Ht 64.0 in | Wt 240.4 lb

## 2016-09-30 DIAGNOSIS — R739 Hyperglycemia, unspecified: Secondary | ICD-10-CM | POA: Diagnosis not present

## 2016-09-30 DIAGNOSIS — F419 Anxiety disorder, unspecified: Secondary | ICD-10-CM | POA: Diagnosis not present

## 2016-09-30 DIAGNOSIS — L03119 Cellulitis of unspecified part of limb: Secondary | ICD-10-CM | POA: Diagnosis not present

## 2016-09-30 DIAGNOSIS — Z6841 Body Mass Index (BMI) 40.0 and over, adult: Secondary | ICD-10-CM

## 2016-09-30 DIAGNOSIS — E785 Hyperlipidemia, unspecified: Secondary | ICD-10-CM

## 2016-09-30 DIAGNOSIS — L853 Xerosis cutis: Secondary | ICD-10-CM

## 2016-09-30 LAB — BAYER DCA HB A1C WAIVED: HB A1C (BAYER DCA - WAIVED): 5.5 %

## 2016-09-30 MED ORDER — CITALOPRAM HYDROBROMIDE 10 MG PO TABS: 10.0000 mg | ORAL_TABLET | Freq: Every day | ORAL | 2 refills | Status: DC

## 2016-09-30 MED ORDER — DOXYCYCLINE HYCLATE 100 MG PO TABS: 100.0000 mg | ORAL_TABLET | Freq: Two times a day (BID) | ORAL | 0 refills | Status: DC

## 2016-09-30 NOTE — Progress Notes (Signed)
  Subjective:   Patient ID: Hannah Allen, female    DOB: 05-02-1966, 50 y.o.   MRN: [id number] CC: Wound Check (Bilateral lower legs)  HPI: Hannah Allen is a 50 y.o. female presenting for Wound Check (Bilateral lower legs)  Started having red areas on legs b/l a few weeks ago One of them strated draining Tried to pop one on L leg Tender with palpation  Anxiety and depression ongoing Stopped celexa, didn't want to feel off  No fevers Otherwise feeling well Drinks sodas regularly Interested in getting healthier  Relevant past medical, surgical, family and social history reviewed. Allergies and medications reviewed and updated. History  Smoking Status  . Former Smoker  . Types: Cigarettes  . Quit date: 08/11/2011  Smokeless Tobacco  . Never Used    Comment: quit 6 months   ROS: Per HPI   Objective:    BP 132/80   Pulse 65   Temp 97.4 F (36.3 C) (Oral)   Ht '5\' 4"'$  (1.626 m)   Wt 240 lb 6.4 oz (109 kg)   BMI 41.26 kg/m   Wt Readings from Last 3 Encounters:  09/30/16 240 lb 6.4 oz (109 kg)  05/12/16 234 lb 3.2 oz (106.2 kg)  04/07/16 233 lb 9.6 oz (106 kg)    Gen: NAD, alert, cooperative with exam, NCAT EYES: EOMI, no conjunctival injection, or no icterus ENT:  TMs pearly gray b/l, OP without erythema LYMPH: no cervical LAD CV: NRRR, normal S1/S2, no murmur, distal pulses 2+ b/l Resp: CTABL, no wheezes, normal WOB Abd: +BS, soft, NTND. no guarding or organomegaly Neuro: Alert and oriented, strength equal b/l UE and LE, coordination grossly normal Skin: L shin with two areas of induration of several cm, lower area with scant amount clear yellow drainage. R shin with similar area of red with induration and tenderness around it for several cm. Excoriations present b/l LE  Assessment & Plan:  Hannah Allen was seen today for wound check.  Diagnoses and all orders for this visit:  Cellulitis of lower extremity, unspecified laterality Treat with doxycycline. Possibly  started from excorations Compression hose while on feet during the day -     doxycycline (VIBRA-TABS) 100 MG tablet; Take 1 tablet (100 mg total) by mouth 2 (two) times daily. 1 po bid  Xerosis Use thick moisturizer legs to help with xerosis  Hyperglycemia -     CMP14+EGFR  Hyperlipidemia, unspecified hyperlipidemia type -     Lipid panel  Anxiety Ongoing symptoms, feels safe at home, no thoughts of self harm Cont to encourage counseling -     citalopram (CELEXA) 10 MG tablet; Take 1 tablet (10 mg total) by mouth daily.  BMI 40.0-44.9, adult (HCC) Decrease soda intake Increase activity F/u in 2 weeks -     Bayer DCA Hb A1c Waived   Follow up plan: Return in about 2 weeks (around 10/14/2016) for cpe. Assunta Found, MD [place]

## 2016-10-01 DIAGNOSIS — Z6841 Body Mass Index (BMI) 40.0 and over, adult: Secondary | ICD-10-CM | POA: Insufficient documentation

## 2016-10-01 DIAGNOSIS — L853 Xerosis cutis: Secondary | ICD-10-CM | POA: Insufficient documentation

## 2016-10-01 LAB — CMP14+EGFR
ALK PHOS: 63 IU/L (ref 39–117)
ALT: 21 IU/L (ref 0–32)
AST: 15 IU/L (ref 0–40)
Albumin/Globulin Ratio: 1.4 (ref 1.2–2.2)
Albumin: 4.2 g/dL (ref 3.5–5.5)
BILIRUBIN TOTAL: 0.4 mg/dL (ref 0.0–1.2)
BUN / CREAT RATIO: 22 (ref 9–23)
BUN: 14 mg/dL (ref 6–24)
CHLORIDE: 101 mmol/L (ref 96–106)
CO2: 23 mmol/L (ref 18–29)
CREATININE: 0.63 mg/dL (ref 0.57–1.00)
Calcium: 8.9 mg/dL (ref 8.7–10.2)
GFR calc Af Amer: 120 mL/min/{1.73_m2} (ref >59)
GFR calc non Af Amer: 104 mL/min/{1.73_m2} (ref >59)
GLUCOSE: 80 mg/dL (ref 65–99)
Globulin, Total: 3 g/dL (ref 1.5–4.5)
Potassium: 4.5 mmol/L (ref 3.5–5.2)
Sodium: 142 mmol/L (ref 134–144)
Total Protein: 7.2 g/dL (ref 6.0–8.5)

## 2016-10-01 LAB — LIPID PANEL
CHOLESTEROL TOTAL: 210 mg/dL — AB (ref 100–199)
Chol/HDL Ratio: 4.6 ratio units — ABNORMAL HIGH (ref 0.0–4.4)
HDL: 46 mg/dL (ref >39)
LDL CALC: 139 mg/dL — AB (ref 0–99)
TRIGLYCERIDES: 126 mg/dL (ref 0–149)
VLDL CHOLESTEROL CAL: 25 mg/dL (ref 5–40)

## 2016-10-14 ENCOUNTER — Ambulatory Visit (INDEPENDENT_AMBULATORY_CARE_PROVIDER_SITE_OTHER): Payer: Managed Care, Other (non HMO) | Admitting: Pediatrics

## 2016-10-14 ENCOUNTER — Encounter: Payer: Self-pay | Admitting: Pediatrics

## 2016-10-14 VITALS — BP 139/87 | HR 71 | Temp 98.7°F | Ht 64.0 in | Wt 237.0 lb

## 2016-10-14 DIAGNOSIS — Z1211 Encounter for screening for malignant neoplasm of colon: Secondary | ICD-10-CM | POA: Diagnosis not present

## 2016-10-14 DIAGNOSIS — R030 Elevated blood-pressure reading, without diagnosis of hypertension: Secondary | ICD-10-CM

## 2016-10-14 DIAGNOSIS — E785 Hyperlipidemia, unspecified: Secondary | ICD-10-CM

## 2016-10-14 DIAGNOSIS — Z6841 Body Mass Index (BMI) 40.0 and over, adult: Secondary | ICD-10-CM | POA: Diagnosis not present

## 2016-10-14 DIAGNOSIS — R2242 Localized swelling, mass and lump, left lower limb: Secondary | ICD-10-CM

## 2016-10-14 DIAGNOSIS — L853 Xerosis cutis: Secondary | ICD-10-CM | POA: Diagnosis not present

## 2016-10-14 MED ORDER — PRAVASTATIN SODIUM 40 MG PO TABS: 40.0000 mg | ORAL_TABLET | Freq: Every day | ORAL | 3 refills | Status: DC

## 2016-10-14 NOTE — Patient Instructions (Signed)
[facility] [facility] Appointment: [phone]  

## 2016-10-14 NOTE — Progress Notes (Signed)
  Subjective:   Patient ID: Hannah Allen, female    DOB: 1966-07-05, 50 y.o.   MRN: [id number] CC: Follow-up multiple med problems HPI: Hannah Allen is a 50 y.o. female presenting for Follow-up  Ankles with soft lipoma on lateral side L side bothering her more than right Seen by podiatry, put in steroid injection, think sit is bigger now She is interested in having it removed   Tobacco: no tobacco use in 5 yrs  Had aortic atherosclerosis on imaging in the past  Colon ca screen: no fam hx, due for colonoscopy Pap smear: due  Mammogram: due  Relevant past medical, surgical, family and social history reviewed. Allergies and medications reviewed and updated. History  Smoking Status  . Former Smoker  . Types: Cigarettes  . Quit date: 08/11/2011  Smokeless Tobacco  . Never Used    Comment: quit 6 months   ROS: Per HPI   Objective:    BP 139/87   Pulse 71   Temp 98.7 F (37.1 C) (Oral)   Ht 5\' 4"  (1.626 m)   Wt 237 lb (107.5 kg)   BMI 40.68 kg/m   Wt Readings from Last 3 Encounters:  10/14/16 237 lb (107.5 kg)  09/30/16 240 lb 6.4 oz (109 kg)  05/12/16 234 lb 3.2 oz (106.2 kg)    Gen: NAD, alert, cooperative with exam, NCAT EYES: EOMI, no conjunctival injection, or no icterus CV: NRRR, normal S1/S2 Resp: CTABL, no wheezes, normal WOB Abd: +BS, soft, NTND. no guarding or organomegaly Ext: No edema, warm Neuro: Alert and oriented, strength equal b/l UE and LE, coordination grossly normal Skin: LE wounds healing, some purple discoloration remains, no erythema, no heat, no induration  Assessment & Plan:  Hannah Allen was seen today for follow-up multiple med problems.  Diagnoses and all orders for this visit:  Xerosis of skin Use thick moisturizing cream, avoid scratching  Elevated blood pressure reading Cont to check at home, let me know if regularly elevated  BMI 40.0-44.9, adult (HCC) Cont lifestyle changes  Hyperlipidemia, unspecified hyperlipidemia  type [facility] Start below -     pravastatin (PRAVACHOL) 40 MG tablet; Take 1 tablet (40 mg total) by mouth daily.  Mass of left ankle -     Ambulatory referral to Orthopedic Surgery  Colon cancer screening -     Fecal occult blood, imunochemical; Future  Follow up plan: Return in about 3 months (around 01/12/2017). Hannah Krasarol Briani Maul, MD [facility]

## 2017-01-20 ENCOUNTER — Ambulatory Visit: Payer: Managed Care, Other (non HMO) | Admitting: Pediatrics

## 2017-08-22 ENCOUNTER — Encounter: Payer: Self-pay | Admitting: Nurse Practitioner

## 2017-08-22 ENCOUNTER — Ambulatory Visit (INDEPENDENT_AMBULATORY_CARE_PROVIDER_SITE_OTHER): Payer: Managed Care, Other (non HMO) | Admitting: Nurse Practitioner

## 2017-08-22 VITALS — BP 137/82 | HR 63 | Temp 97.0°F | Ht 64.0 in | Wt 244.0 lb

## 2017-08-22 DIAGNOSIS — M545 Low back pain, unspecified: Secondary | ICD-10-CM

## 2017-08-22 MED ORDER — NAPROXEN 500 MG PO TABS: 500.0000 mg | ORAL_TABLET | Freq: Two times a day (BID) | ORAL | 1 refills | Status: DC

## 2017-08-22 MED ORDER — CYCLOBENZAPRINE HCL 10 MG PO TABS: 10.0000 mg | ORAL_TABLET | Freq: Three times a day (TID) | ORAL | 1 refills | Status: DC | PRN

## 2017-08-22 NOTE — Patient Instructions (Signed)

## 2017-08-22 NOTE — Progress Notes (Signed)
   Subjective:    Patient ID: Hannah Allen, female    DOB: 1966-06-28, 51 y.o.   MRN: [id number]  HPI Patinet comes in today c/o right back pain. Pain is worse when she moves. Rates pain 7/10. Being still makes pain better.pain is constant. Started 2 days ago. Has worsened. 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    Review of Systems  Constitutional: Negative for chills and fever.  HENT: Negative.   Respiratory: Negative.   Cardiovascular: Negative.   Gastrointestinal: Negative.   Genitourinary: Negative for dysuria, frequency and urgency.  Musculoskeletal: Positive for back pain.  Neurological: Negative.   All other systems reviewed and are negative.      Objective:   Physical Exam  Constitutional: She is oriented to person, place, and time. She appears well-developed and well-nourished. No distress.  Cardiovascular: Normal rate and regular rhythm.   Pulmonary/Chest: Effort normal and breath sounds normal.  Abdominal: Soft. Bowel sounds are normal.  Genitourinary:  Genitourinary Comments: No CVA Tenderness  Musculoskeletal:  Rises slowly from sitting to standing Point tenderness right lower back (-) SLR bil Motor strength and sensation distally intact  Neurological: She is alert and oriented to person, place, and time.  Skin: Skin is warm.  Psychiatric: She has a normal mood and affect. Her behavior is normal. Judgment and thought content normal.   BP 137/82   Pulse 63   Temp (!) 97 F (36.1 C) (Oral)   Ht 5\' 4"  (1.626 m)   Wt 244 lb (110.7 kg)   BMI 41.88 kg/m         Assessment & Plan:   1. Acute right-sided low back pain without sciatica    Meds ordered this encounter  Medications  . cyclobenzaprine (FLEXERIL) 10 MG tablet    Sig: Take 1 tablet (10 mg total) by mouth 3 (three) times daily as needed for muscle spasms.    Dispense:  30 tablet    Refill:  1    Order Specific Question:   Supervising  Provider    Answer:   [name], CAROL L [[id number]]  . naproxen (NAPROSYN) 500 MG tablet    Sig: Take 1 tablet (500 mg total) by mouth 2 (two) times daily with a meal.    Dispense:  60 tablet    Refill:  1    Order Specific Question:   Supervising Provider    Answer:   [name], CAROL L [[id number]]   Moist heat Rest No heavy lifting Out of work for 2 days RTO prn  Hannah Daphine DeutscherMartin, FNP

## 2018-05-11 ENCOUNTER — Encounter: Payer: Self-pay | Admitting: Family Medicine

## 2018-05-11 ENCOUNTER — Ambulatory Visit (INDEPENDENT_AMBULATORY_CARE_PROVIDER_SITE_OTHER): Payer: Managed Care, Other (non HMO) | Admitting: Family Medicine

## 2018-05-11 VITALS — BP 125/75 | HR 71 | Temp 97.6°F | Ht 64.0 in | Wt 245.6 lb

## 2018-05-11 DIAGNOSIS — L03116 Cellulitis of left lower limb: Secondary | ICD-10-CM

## 2018-05-11 MED ORDER — CEPHALEXIN 500 MG PO CAPS: 500.0000 mg | ORAL_CAPSULE | Freq: Four times a day (QID) | ORAL | 0 refills | Status: DC

## 2018-05-11 NOTE — Progress Notes (Signed)
   HPI  Patient presents today here with red painful area on the leg.  Patient explains that she developed the spot on the left lower leg 5 days ago that began to get red hot and get larger over the last few days.  She denies fever, chills, sweats.  She is tolerating food and fluids by mouth like usual  She is unsure if she had a bug bite, if she scratched her leg, or if something else happened.  PMH: Smoking status noted ROS: Per HPI  Objective: BP 125/75   Pulse 71   Temp 97.6 F (36.4 C) (Oral)   Ht 5\' 4"  (1.626 m)   Wt 245 lb 9.6 oz (111.4 kg)   BMI 42.16 kg/m  Gen: NAD, alert, cooperative with exam HEENT: NCAT CV: RRR, good S1/S2, no murmur Resp: CTABL, no wheezes, non-labored Ext: No edema, warm Neuro: Alert and oriented, No gross deficits Skin Left lower extremity in the lateral proximal lower extremity she has a 2.3 cm x 3 cm area of erythema and induration surrounded by an area of erythema measuring 13.5 cm x 7.5 cm. No fluctuance, no drainage with palpation, she is slightly tender to the touch and warm over the area of erythema  Assessment and plan:  #Cellulitis of lower extremity Treat with Keflex, does not appear to be developing an abscess like staph, likely strep Return to clinic if not improving or worsening after 24 hours. No signs of systemic infection Return to clinic or contact us with any questions    Meds ordered this encounter  Medications  . cephALEXin (KEFLEX) 500 MG capsule    Sig: Take 1 capsule (500 mg total) by mouth 4 (four) times daily.    Dispense:  28 capsule    Refill:  0    Murtis SinkSam Bradshaw, MD [facility] 05/11/2018, 1:08 PM

## 2018-05-11 NOTE — Patient Instructions (Signed)

## 2018-05-14 ENCOUNTER — Ambulatory Visit (INDEPENDENT_AMBULATORY_CARE_PROVIDER_SITE_OTHER): Payer: Managed Care, Other (non HMO) | Admitting: Family Medicine

## 2018-05-14 ENCOUNTER — Ambulatory Visit: Payer: Managed Care, Other (non HMO) | Admitting: Pediatrics

## 2018-05-14 ENCOUNTER — Telehealth: Payer: Self-pay | Admitting: Pediatrics

## 2018-05-14 VITALS — BP 130/87 | HR 78 | Temp 99.0°F | Ht 64.0 in | Wt 244.0 lb

## 2018-05-14 DIAGNOSIS — L02416 Cutaneous abscess of left lower limb: Secondary | ICD-10-CM | POA: Diagnosis not present

## 2018-05-14 DIAGNOSIS — L03116 Cellulitis of left lower limb: Secondary | ICD-10-CM

## 2018-05-14 MED ORDER — KETOROLAC TROMETHAMINE 30 MG/ML IJ SOLN
30.0000 mg | Freq: Once | INTRAMUSCULAR | Status: AC
  Administered 2018-05-14: 30 mg via INTRAMUSCULAR

## 2018-05-14 MED ORDER — CEFTRIAXONE SODIUM 1 G IJ SOLR
1.0000 g | Freq: Once | INTRAMUSCULAR | Status: AC
  Administered 2018-05-14: 1 g via INTRAMUSCULAR

## 2018-05-14 MED ORDER — DOXYCYCLINE HYCLATE 100 MG PO TABS: 100.0000 mg | ORAL_TABLET | Freq: Two times a day (BID) | ORAL | 0 refills | Status: DC

## 2018-05-14 NOTE — Patient Instructions (Signed)
I have added doxycycline free to take twice daily for the next 10 days.  Make sure that you take this with food.  Continue the cephalexin 4 times daily as prescribed.  You may take ibuprofen if needed every 8 hours for pain and swelling.  Consider applying ice to the affected area to reduce pain and swelling.  You are given a dose of Rocephin intramuscularly and Toradol intramuscularly here in office.  Follow-up in 48 hours for wound recheck.  If symptoms worsen between now and that appointment, you develop fevers, chills, nausea, vomiting, severe fatigue, worsening redness, please seek immediate medical attention the emergency department.   Skin Abscess A skin abscess is an infected area on or under your skin that contains pus and other material. An abscess can happen almost anywhere on your body. Some abscesses break open (rupture) on their own. Most continue to get worse unless they are treated. The infection can spread deeper into the body and into your blood, which can make you feel sick. Treatment usually involves draining the abscess. Follow these instructions at home: Abscess Care  If you have an abscess that has not drained, place a warm, clean, wet washcloth over the abscess several times a day. Do this as told by your doctor.  Follow instructions from your doctor about how to take care of your abscess. Make sure you: ? Cover the abscess with a bandage (dressing). ? Change your bandage or gauze as told by your doctor. ? Wash your hands with soap and water before you change the bandage or gauze. If you cannot use soap and water, use hand sanitizer.  Check your abscess every day for signs that the infection is getting worse. Check for: ? More redness, swelling, or pain. ? More fluid or blood. ? Warmth. ? More pus or a bad smell. Medicines   Take over-the-counter and prescription medicines only as told by your doctor.  If you were prescribed an antibiotic medicine, take it as told  by your doctor. Do not stop taking the antibiotic even if you start to feel better. General instructions  To avoid spreading the infection: ? Do not share personal care items, towels, or hot tubs with others. ? Avoid making skin-to-skin contact with other people.  Keep all follow-up visits as told by your doctor. This is important. Contact a doctor if:  You have more redness, swelling, or pain around your abscess.  You have more fluid or blood coming from your abscess.  Your abscess feels warm when you touch it.  You have more pus or a bad smell coming from your abscess.  You have a fever.  Your muscles ache.  You have chills.  You feel sick. Get help right away if:  You have very bad (severe) pain.  You see red streaks on your skin spreading away from the abscess. This information is not intended to replace advice given to you by your health care provider. Make sure you discuss any questions you have with your health care provider. Document Released: 04/04/2008 Document Revised: 06/12/2016 Document Reviewed: 08/26/2015 Elsevier Interactive Patient Education  [employer]2018 [employer]

## 2018-05-14 NOTE — Telephone Encounter (Signed)
Pt was seen Friday and her leg is bigger and is oozing and is red, pt states that she is going to try and send a picture through mychart, pt is scheduled for 6 pm tonight but pt is concerned that she needs to be seen before 6. Please call the pt, if you call before 11 call her work at [phone]. If after 11 call her cell

## 2018-05-14 NOTE — Telephone Encounter (Signed)
Aware.  Must keep the 6 pm appointment.  Try to elevate leg as much as possible today and don't miss any doses of antibiotic.

## 2018-05-14 NOTE — Progress Notes (Signed)
Subjective: CC: cellulitis PCP: Johna Sheriff, MD ZOX:[id number] Hannah Allen is a 52 y.o. female presenting to clinic today for:  1. Cellulitis Patient was seen on 05/11/2018 for a left lower extremity cellulitis that was measuring 13.5 cm by 7.5 cm.  No purulent discharge was appreciated during that visit and therefore she was placed on Keflex 4 times daily.  She follows up today noting that the redness has spread and that she now has active drainage. No fevers, chills, nausea, vomiting.  She does report increased pain but notes that she is not currently taking anything for the pain because she was not sure if anything would interact with antibiotic.  No medical history of diabetes or other immunocompromising states.  She has been also applying warm compresses.   ROS: Per HPI  No Known Allergies Past Medical History:  Diagnosis Date  . Allergy   . Anxiety   . Hyperlipidemia   . Varicose veins     Current Outpatient Medications:  .  cephALEXin (KEFLEX) 500 MG capsule, Take 1 capsule (500 mg total) by mouth 4 (four) times daily., Disp: 28 capsule, Rfl: 0 Social History   Socioeconomic History  . Marital status: Single    Spouse name: Not on file  . Number of children: Not on file  . Years of education: Not on file  . Highest education level: Not on file  Occupational History  . Not on file  Social Needs  . Financial resource strain: Not on file  . Food insecurity:    Worry: Not on file    Inability: Not on file  . Transportation needs:    Medical: Not on file    Non-medical: Not on file  Tobacco Use  . Smoking status: Former Smoker    Types: Cigarettes    Last attempt to quit: 08/11/2011    Years since quitting: 6.7  . Smokeless tobacco: Never Used  . Tobacco comment: quit 6 months  Substance and Sexual Activity  . Alcohol use: No  . Drug use: No  . Sexual activity: Not on file  Lifestyle  . Physical activity:    Days per week: Not on file    Minutes per session: Not  on file  . Stress: Not on file  Relationships  . Social connections:    Talks on phone: Not on file    Gets together: Not on file    Attends religious service: Not on file    Active member of club or organization: Not on file    Attends meetings of clubs or organizations: Not on file    Relationship status: Not on file  . Intimate partner violence:    Fear of current or ex partner: Not on file    Emotionally abused: Not on file    Physically abused: Not on file    Forced sexual activity: Not on file  Other Topics Concern  . Not on file  Social History Narrative  . Not on file   Family History  Problem Relation Age of Onset  . Heart disease Maternal Grandmother   . Diabetes Maternal Grandmother     Objective: Office vital signs reviewed. BP 130/87   Pulse 78   Temp 99 F (37.2 C) (Oral)   Ht 5\' 4"  (1.626 m)   Wt 244 lb (110.7 kg)   BMI 41.88 kg/m   Physical Examination:  General: Awake, alert, obese, nontoxic, No acute distress Cardio: regular rate and rhythm, S1S2 heard, no murmurs appreciated Pulm:  clear to auscultation bilaterally, no wheezes, rhonchi or rales; normal work of breathing on room air Extremities: warm, well perfused, No edema, cyanosis or clubbing; +2 pulses bilaterally Skin: Patient has an 8 cm x 7 cm area of erythema with associated induration.  There is a visible punctum with active purulent drainage.  She is tender to palpation.  Assessment/ Plan: 52 y.o. female   1. Cutaneous abscess of left lower extremity Patient with low-grade fever 99 F here in office.  Vital signs otherwise stable.  She appears nontoxic.  The lesion appears to have become abscessed since last check despite use of oral Keflex 4 times daily.  She was treated with a dose of Rocephin 1 g IM here in office.  I have instructed her to continue oral Keflex 4 times daily.  Add doxycycline p.o. twice daily for 10 days.  Okay to use ibuprofen 60 mg p.o. 3 times daily as needed pain or  swelling.  May use ice to affected area if needed.  Elevate lower extremity.  Okay to use probiotic.  Follow-up in 48 hours for wound recheck.  If symptoms worsen or she develops any other worrisome symptoms or signs, she is to seek immediate medical attention the emergency department. - cefTRIAXone (ROCEPHIN) injection 1 g - ketorolac (TORADOL) 30 MG/ML injection 30 mg  2. Cellulitis of left lower extremity As above. - cefTRIAXone (ROCEPHIN) injection 1 g - ketorolac (TORADOL) 30 MG/ML injection 30 mg  Meds ordered this encounter  Medications  . cefTRIAXone (ROCEPHIN) injection 1 g  . ketorolac (TORADOL) 30 MG/ML injection 30 mg  . doxycycline (VIBRA-TABS) 100 MG tablet    Sig: Take 1 tablet (100 mg total) by mouth 2 (two) times daily.    Dispense:  20 tablet    Refill:  0     Deborah Dondero Hulen SkainsM Thaddus Mcdowell, DO [facility] [phone]

## 2018-05-16 ENCOUNTER — Encounter: Payer: Self-pay | Admitting: Family Medicine

## 2018-05-16 ENCOUNTER — Ambulatory Visit (INDEPENDENT_AMBULATORY_CARE_PROVIDER_SITE_OTHER): Payer: Managed Care, Other (non HMO) | Admitting: Family Medicine

## 2018-05-16 VITALS — BP 135/89 | HR 62 | Temp 97.2°F | Ht 64.0 in | Wt 244.0 lb

## 2018-05-16 DIAGNOSIS — L02416 Cutaneous abscess of left lower limb: Secondary | ICD-10-CM

## 2018-05-16 NOTE — Progress Notes (Signed)
Subjective: CC: LE wound PCP: [name], Carol L, MD ZOX:WRUEAVWUHPI:Nilza Margo Hannah Allen is a 52 y.o. female presenting to clinic today for:  1. LE abscess and cellulitis  Patient was seen 2 days ago for left lower extremity cellulitis with cutaneous abscess.  She was given a dose of Rocephin and started on doxycycline to take in addition to her oral Keflex.  She follows up today for 48-hour recheck.  She reports improvement in the redness and pain.  She still has a little bit of swelling around the lesion but states that it is gotten quite a bit better.  He continues to drain.  Denies any intolerance to the doxycycline.  No fevers, chills.  No nausea or vomiting.   ROS: Per HPI  No Known Allergies Past Medical History:  Diagnosis Date  . Allergy   . Anxiety   . Hyperlipidemia   . Varicose veins     Current Outpatient Medications:  .  cephALEXin (KEFLEX) 500 MG capsule, Take 1 capsule (500 mg total) by mouth 4 (four) times daily., Disp: 28 capsule, Rfl: 0 .  doxycycline (VIBRA-TABS) 100 MG tablet, Take 1 tablet (100 mg total) by mouth 2 (two) times daily., Disp: 20 tablet, Rfl: 0 Social History   Socioeconomic History  . Marital status: Single    Spouse name: Not on file  . Number of children: Not on file  . Years of education: Not on file  . Highest education level: Not on file  Occupational History  . Not on file  Social Needs  . Financial resource strain: Not on file  . Food insecurity:    Worry: Not on file    Inability: Not on file  . Transportation needs:    Medical: Not on file    Non-medical: Not on file  Tobacco Use  . Smoking status: Former Smoker    Types: Cigarettes    Last attempt to quit: 08/11/2011    Years since quitting: 6.7  . Smokeless tobacco: Never Used  . Tobacco comment: quit 6 months  Substance and Sexual Activity  . Alcohol use: No  . Drug use: No  . Sexual activity: Not on file  Lifestyle  . Physical activity:    Days per week: Not on file    Minutes  per session: Not on file  . Stress: Not on file  Relationships  . Social connections:    Talks on phone: Not on file    Gets together: Not on file    Attends religious service: Not on file    Active member of club or organization: Not on file    Attends meetings of clubs or organizations: Not on file    Relationship status: Not on file  . Intimate partner violence:    Fear of current or ex partner: Not on file    Emotionally abused: Not on file    Physically abused: Not on file    Forced sexual activity: Not on file  Other Topics Concern  . Not on file  Social History Narrative  . Not on file   Family History  Problem Relation Age of Onset  . Heart disease Maternal Grandmother   . Diabetes Maternal Grandmother     Objective: Office vital signs reviewed. BP 135/89   Pulse 62   Temp (!) 97.2 F (36.2 C) (Oral)   Ht 5\' 4"  (1.626 m)   Wt 244 lb (110.7 kg)   BMI 41.88 kg/m   Physical Examination:  General: Awake, alert,  well appearing, No acute distress Skin: Area of erythema has substantially decreased compared to last visit.  She continues to have about a 3 and half centimeter by 3-1/2 cm area of induration with about a half a centimeter of surrounding erythema.  Tenderness to palpation is present but much milder than previous visit.  Lesion is actively draining.  Drainage appears to be serosanguineous fluid.  Assessment/ Plan: 52 y.o. female   1. Cutaneous abscess of left lower extremity Patient is afebrile well-appearing with normal vital signs.  Abscess and associated cellulitis seems to be improving substantially compared to previous visit.  Continue Keflex and doxycycline.  She will follow-up in 1 week for recheck or sooner if needed.  May need to extend doxycycline if not totally resolved by next visit.  Wound care reviewed with the patient.  Raliegh Ip, DO [facility] [phone]

## 2018-05-21 ENCOUNTER — Telehealth: Payer: Self-pay

## 2018-05-21 MED ORDER — FLUCONAZOLE 150 MG PO TABS: 150.0000 mg | ORAL_TABLET | Freq: Once | ORAL | 0 refills | Status: AC

## 2018-05-21 NOTE — Telephone Encounter (Signed)
Okay to call in diflucan 150 mg one daily #1

## 2018-05-21 NOTE — Telephone Encounter (Signed)
Patient has been on several antibiotics recently and has began having vaginal itching and discomfort.  Would like to know if [facility]  Please advise.

## 2018-05-21 NOTE — Telephone Encounter (Signed)
Called in,pt aware 

## 2018-05-31 ENCOUNTER — Ambulatory Visit (INDEPENDENT_AMBULATORY_CARE_PROVIDER_SITE_OTHER): Payer: Managed Care, Other (non HMO) | Admitting: Pediatrics

## 2018-05-31 ENCOUNTER — Encounter: Payer: Self-pay | Admitting: Pediatrics

## 2018-05-31 VITALS — BP 165/94 | HR 60 | Temp 97.8°F | Ht 64.0 in | Wt 247.0 lb

## 2018-05-31 DIAGNOSIS — L03116 Cellulitis of left lower limb: Secondary | ICD-10-CM | POA: Diagnosis not present

## 2018-05-31 DIAGNOSIS — R030 Elevated blood-pressure reading, without diagnosis of hypertension: Secondary | ICD-10-CM

## 2018-05-31 DIAGNOSIS — I700 Atherosclerosis of aorta: Secondary | ICD-10-CM

## 2018-05-31 DIAGNOSIS — H6593 Unspecified nonsuppurative otitis media, bilateral: Secondary | ICD-10-CM | POA: Diagnosis not present

## 2018-05-31 MED ORDER — MUPIROCIN 2 % EX OINT: TOPICAL_OINTMENT | CUTANEOUS | 1 refills | Status: DC

## 2018-05-31 MED ORDER — FLUTICASONE PROPIONATE 50 MCG/ACT NA SUSP: 2.0000 | Freq: Every day | NASAL | 6 refills | Status: DC

## 2018-05-31 NOTE — Progress Notes (Signed)
  Subjective:   Patient ID: Hannah Allen, female    DOB: Feb 13, 1966, 52 y.o.   MRN: [id number] CC: Wound Check (right leg)  HPI: Hannah Allen is a 52 y.o. female   Wound much improved left leg.  Patient has pictures with her on her phone.  Still with open ulceration, no drains some.  Pain is almost entirely resolved.  No fevers.  Feeling well otherwise.  She checks her blood pressure at work, usually 120s over 70s.  History of atherosclerosis from chest x-ray done by prior PCP.  She is not currently on cholesterol medicines.  Relevant past medical, surgical, family and social history reviewed. Allergies and medications reviewed and updated. Social History   Tobacco Use  Smoking Status Former Smoker  . Types: Cigarettes  . Last attempt to quit: 08/11/2011  . Years since quitting: 6.8  Smokeless Tobacco Never Used  Tobacco Comment   quit 6 months   ROS: Per HPI   Objective:    BP (!) 165/94   Pulse 60   Temp 97.8 F (36.6 C) (Oral)   Ht _0  (1.626 m)   Wt 247 lb (112 kg)   BMI 42.40 kg/m   Wt Readings from Last 3 Encounters:  05/31/18 247 lb (112 kg)  05/16/18 244 lb (110.7 kg)  05/14/18 244 lb (110.7 kg)    Gen: NAD, alert, cooperative with exam, NCAT EYES: EOMI, no conjunctival injection, or no icterus ENT:  TMs dull gray with layering yellow effusion b/l, OP without erythema LYMPH: no cervical LAD CV: NRRR, normal S1/S2, no murmur, distal pulses 2+ b/l Resp: CTABL, no wheezes, normal WOB Abd: +BS, soft, NTND. no guarding or organomegaly Ext: No edema, warm Neuro: Alert and oriented, strength equal b/l UE and LE, coordination grossly normal Skin: Approximately 3 mm wide ulceration with moist yellow base left lateral lower leg.  Slight induration present around it, slightly purple, no redness.  No tenderness. Varicose veins present bilaterally.  Assessment & Plan:  Hannah Allen was seen today for wound check.  Diagnoses and all orders for this  visit:  Cellulitis of left lower extremity With much improved.  Continue to use antibiotic ointment on the ulceration. -     mupirocin ointment (BACTROBAN) 2 %; Use twice a day on affected areas  Fluid level behind tympanic membrane of both ears Start below. -     fluticasone (FLONASE) 50 MCG/ACT nasal spray; Place 2 sprays into both nostrils daily.  Atherosclerosis of aorta ([place]) We will check blood work.  Patient to come back in 4 weeks for follow-up and physical. -     BMP8+EGFR -     CBC with Differential/Platelet -     Lipid panel  Elevated blood pressure reading Much improved at home per patient.  Patient to check blood pressures regularly, follow-up in 1 month.  Follow up plan: Return in about 1 month (around 06/28/2018) for cpe. Assunta Found, MD [place]

## 2018-06-08 ENCOUNTER — Other Ambulatory Visit: Payer: Managed Care, Other (non HMO)

## 2018-06-09 LAB — LIPID PANEL
CHOLESTEROL TOTAL: 229 mg/dL — AB (ref 100–199)
Chol/HDL Ratio: 4.2 ratio (ref 0.0–4.4)
HDL: 54 mg/dL (ref >39)
LDL CALC: 145 mg/dL — AB (ref 0–99)
TRIGLYCERIDES: 151 mg/dL — AB (ref 0–149)
VLDL Cholesterol Cal: 30 mg/dL (ref 5–40)

## 2018-06-09 LAB — BMP8+EGFR
BUN/Creatinine Ratio: 23 (ref 9–23)
BUN: 18 mg/dL (ref 6–24)
CALCIUM: 9.3 mg/dL (ref 8.7–10.2)
CHLORIDE: 99 mmol/L (ref 96–106)
CO2: 25 mmol/L (ref 20–29)
Creatinine, Ser: 0.77 mg/dL (ref 0.57–1.00)
GFR calc Af Amer: 102 mL/min/{1.73_m2} (ref >59)
GFR calc non Af Amer: 88 mL/min/{1.73_m2} (ref >59)
GLUCOSE: 103 mg/dL — AB (ref 65–99)
Potassium: 4.2 mmol/L (ref 3.5–5.2)
Sodium: 138 mmol/L (ref 134–144)

## 2018-06-09 LAB — CBC WITH DIFFERENTIAL/PLATELET
BASOS ABS: 0 10*3/uL (ref 0.0–0.2)
Basos: 1 %
EOS (ABSOLUTE): 0.1 10*3/uL (ref 0.0–0.4)
Eos: 2 %
HEMOGLOBIN: 11.9 g/dL (ref 11.1–15.9)
Hematocrit: 37.2 % (ref 34.0–46.6)
IMMATURE GRANS (ABS): 0 10*3/uL (ref 0.0–0.1)
Immature Granulocytes: 0 %
LYMPHS ABS: 1.7 10*3/uL (ref 0.7–3.1)
LYMPHS: 43 %
MCH: 28.4 pg (ref 26.6–33.0)
MCHC: 32 g/dL (ref 31.5–35.7)
MCV: 89 fL (ref 79–97)
MONOCYTES: 8 %
Monocytes Absolute: 0.3 10*3/uL (ref 0.1–0.9)
Neutrophils Absolute: 1.9 10*3/uL (ref 1.4–7.0)
Neutrophils: 46 %
Platelets: 226 10*3/uL (ref 150–450)
RBC: 4.19 x10E6/uL (ref 3.77–5.28)
RDW: 13.8 % (ref 12.3–15.4)
WBC: 4 10*3/uL (ref 3.4–10.8)

## 2018-06-11 ENCOUNTER — Other Ambulatory Visit: Payer: Self-pay | Admitting: *Deleted

## 2018-06-11 MED ORDER — PRAVASTATIN SODIUM 40 MG PO TABS: 40.0000 mg | ORAL_TABLET | Freq: Every day | ORAL | 3 refills | Status: DC

## 2018-09-18 ENCOUNTER — Encounter: Payer: Self-pay | Admitting: Family Medicine

## 2018-09-18 ENCOUNTER — Ambulatory Visit (INDEPENDENT_AMBULATORY_CARE_PROVIDER_SITE_OTHER): Payer: Managed Care, Other (non HMO) | Admitting: Family Medicine

## 2018-09-18 VITALS — BP 149/77 | HR 64 | Temp 97.0°F | Ht 64.0 in | Wt 241.0 lb

## 2018-09-18 DIAGNOSIS — E782 Mixed hyperlipidemia: Secondary | ICD-10-CM | POA: Diagnosis not present

## 2018-09-18 DIAGNOSIS — I10 Essential (primary) hypertension: Secondary | ICD-10-CM | POA: Insufficient documentation

## 2018-09-18 MED ORDER — PRAVASTATIN SODIUM 40 MG PO TABS: 40.0000 mg | ORAL_TABLET | Freq: Every day | ORAL | 3 refills | Status: DC

## 2018-09-18 MED ORDER — CHLORTHALIDONE 25 MG PO TABS: 25.0000 mg | ORAL_TABLET | Freq: Every day | ORAL | 0 refills | Status: DC

## 2018-09-18 MED ORDER — LISINOPRIL 10 MG PO TABS: 10.0000 mg | ORAL_TABLET | Freq: Every day | ORAL | 0 refills | Status: DC

## 2018-09-18 NOTE — Patient Instructions (Signed)
DASH Eating Plan DASH stands for "Dietary Approaches to Stop Hypertension." The DASH eating plan is a healthy eating plan that has been shown to reduce high blood pressure (hypertension). It may also reduce your risk for type 2 diabetes, heart disease, and stroke. The DASH eating plan may also help with weight loss. What are tips for following this plan? General guidelines  Avoid eating more than 2,300 mg (milligrams) of salt (sodium) a day. If you have hypertension, you may need to reduce your sodium intake to 1,500 mg a day.  Limit alcohol intake to no more than 1 drink a day for nonpregnant women and 2 drinks a day for men. One drink equals 12 oz of beer, 5 oz of wine, or 1 oz of hard liquor.  Work with your health care provider to maintain a healthy body weight or to lose weight. Ask what an ideal weight is for you.  Get at least 30 minutes of exercise that causes your heart to beat faster (aerobic exercise) most days of the week. Activities may include walking, swimming, or biking.  Work with your health care provider or diet and nutrition specialist (dietitian) to adjust your eating plan to your individual calorie needs. Reading food labels  Check food labels for the amount of sodium per serving. Choose foods with less than 5 percent of the Daily Value of sodium. Generally, foods with less than 300 mg of sodium per serving fit into this eating plan.  To find whole grains, look for the word "whole" as the first word in the ingredient list. Shopping  Buy products labeled as "low-sodium" or "no salt added."  Buy fresh foods. Avoid canned foods and premade or frozen meals. Cooking  Avoid adding salt when cooking. Use salt-free seasonings or herbs instead of table salt or sea salt. Check with your health care provider or pharmacist before using salt substitutes.  Do not fry foods. Cook foods using healthy methods such as baking, boiling, grilling, and broiling instead.  Cook with  heart-healthy oils, such as olive, canola, soybean, or sunflower oil. Meal planning   Eat a balanced diet that includes: ? 5 or more servings of fruits and vegetables each day. At each meal, try to fill half of your plate with fruits and vegetables. ? Up to 6-8 servings of whole grains each day. ? Less than 6 oz of lean meat, poultry, or fish each day. A 3-oz serving of meat is about the same size as a deck of cards. One egg equals 1 oz. ? 2 servings of low-fat dairy each day. ? A serving of nuts, seeds, or beans 5 times each week. ? Heart-healthy fats. Healthy fats called Omega-3 fatty acids are found in foods such as flaxseeds and coldwater fish, like sardines, salmon, and mackerel.  Limit how much you eat of the following: ? Canned or prepackaged foods. ? Food that is high in trans fat, such as fried foods. ? Food that is high in saturated fat, such as fatty meat. ? Sweets, desserts, sugary drinks, and other foods with added sugar. ? Full-fat dairy products.  Do not salt foods before eating.  Try to eat at least 2 vegetarian meals each week.  Eat more home-cooked food and less restaurant, buffet, and fast food.  When eating at a restaurant, ask that your food be prepared with less salt or no salt, if possible. What foods are recommended? The items listed may not be a complete list. Talk with your dietitian about what   dietary choices are best for you. Grains Whole-grain or whole-wheat bread. Whole-grain or whole-wheat pasta. Brown rice. Oatmeal. Quinoa. Bulgur. Whole-grain and low-sodium cereals. Pita bread. Low-fat, low-sodium crackers. Whole-wheat flour tortillas. Vegetables Fresh or frozen vegetables (raw, steamed, roasted, or grilled). Low-sodium or reduced-sodium tomato and vegetable juice. Low-sodium or reduced-sodium tomato sauce and tomato paste. Low-sodium or reduced-sodium canned vegetables. Fruits All fresh, dried, or frozen fruit. Canned fruit in natural juice (without  added sugar). Meat and other protein foods Skinless chicken or turkey. Ground chicken or turkey. Pork with fat trimmed off. Fish and seafood. Egg whites. Dried beans, peas, or lentils. Unsalted nuts, nut butters, and seeds. Unsalted canned beans. Lean cuts of beef with fat trimmed off. Low-sodium, lean deli meat. Dairy Low-fat (1%) or fat-free (skim) milk. Fat-free, low-fat, or reduced-fat cheeses. Nonfat, low-sodium ricotta or cottage cheese. Low-fat or nonfat yogurt. Low-fat, low-sodium cheese. Fats and oils Soft margarine without trans fats. Vegetable oil. Low-fat, reduced-fat, or light mayonnaise and salad dressings (reduced-sodium). Canola, safflower, olive, soybean, and sunflower oils. Avocado. Seasoning and other foods Herbs. Spices. Seasoning mixes without salt. Unsalted popcorn and pretzels. Fat-free sweets. What foods are not recommended? The items listed may not be a complete list. Talk with your dietitian about what dietary choices are best for you. Grains Baked goods made with fat, such as croissants, muffins, or some breads. Dry pasta or rice meal packs. Vegetables Creamed or fried vegetables. Vegetables in a cheese sauce. Regular canned vegetables (not low-sodium or reduced-sodium). Regular canned tomato sauce and paste (not low-sodium or reduced-sodium). Regular tomato and vegetable juice (not low-sodium or reduced-sodium). Pickles. Olives. Fruits Canned fruit in a light or heavy syrup. Fried fruit. Fruit in cream or butter sauce. Meat and other protein foods Fatty cuts of meat. Ribs. Fried meat. Bacon. Sausage. Bologna and other processed lunch meats. Salami. Fatback. Hotdogs. Bratwurst. Salted nuts and seeds. Canned beans with added salt. Canned or smoked fish. Whole eggs or egg yolks. Chicken or turkey with skin. Dairy Whole or 2% milk, cream, and half-and-half. Whole or full-fat cream cheese. Whole-fat or sweetened yogurt. Full-fat cheese. Nondairy creamers. Whipped toppings.  Processed cheese and cheese spreads. Fats and oils Butter. Stick margarine. Lard. Shortening. Ghee. Bacon fat. Tropical oils, such as coconut, palm kernel, or palm oil. Seasoning and other foods Salted popcorn and pretzels. Onion salt, garlic salt, seasoned salt, table salt, and sea salt. Worcestershire sauce. Tartar sauce. Barbecue sauce. Teriyaki sauce. Soy sauce, including reduced-sodium. Steak sauce. Canned and packaged gravies. Fish sauce. Oyster sauce. Cocktail sauce. Horseradish that you find on the shelf. Ketchup. Mustard. Meat flavorings and tenderizers. Bouillon cubes. Hot sauce and Tabasco sauce. Premade or packaged marinades. Premade or packaged taco seasonings. Relishes. Regular salad dressings. Where to find more information:  National Heart, Lung, and Blood Institute: www.nhlbi.nih.gov  American Heart Association: www.heart.org Summary  The DASH eating plan is a healthy eating plan that has been shown to reduce high blood pressure (hypertension). It may also reduce your risk for type 2 diabetes, heart disease, and stroke.  With the DASH eating plan, you should limit salt (sodium) intake to 2,300 mg a day. If you have hypertension, you may need to reduce your sodium intake to 1,500 mg a day.  When on the DASH eating plan, aim to eat more fresh fruits and vegetables, whole grains, lean proteins, low-fat dairy, and heart-healthy fats.  Work with your health care provider or diet and nutrition specialist (dietitian) to adjust your eating plan to your individual   calorie needs. This information is not intended to replace advice given to you by your health care provider. Make sure you discuss any questions you have with your health care provider. Document Released: 10/06/2011 Document Revised: 10/10/2016 Document Reviewed: 10/10/2016 Elsevier Interactive Patient Education  2018 Elsevier Inc.  Hypertension Hypertension is another name for high blood pressure. High blood pressure  forces your heart to work harder to pump blood. This can cause problems over time. There are two numbers in a blood pressure reading. There is a top number (systolic) over a bottom number (diastolic). It is best to have a blood pressure below 120/80. Healthy choices can help lower your blood pressure. You may need medicine to help lower your blood pressure if:  Your blood pressure cannot be lowered with healthy choices.  Your blood pressure is higher than 130/80.  Follow these instructions at home: Eating and drinking  If directed, follow the DASH eating plan. This diet includes: ? Filling half of your plate at each meal with fruits and vegetables. ? Filling one quarter of your plate at each meal with whole grains. Whole grains include whole wheat pasta, brown rice, and whole grain bread. ? Eating or drinking low-fat dairy products, such as skim milk or low-fat yogurt. ? Filling one quarter of your plate at each meal with low-fat (lean) proteins. Low-fat proteins include fish, skinless chicken, eggs, beans, and tofu. ? Avoiding fatty meat, cured and processed meat, or chicken with skin. ? Avoiding premade or processed food.  Eat less than 1,500 mg of salt (sodium) a day.  Limit alcohol use to no more than 1 drink a day for nonpregnant women and 2 drinks a day for men. One drink equals 12 oz of beer, 5 oz of wine, or 1 oz of hard liquor. Lifestyle  Work with your doctor to stay at a healthy weight or to lose weight. Ask your doctor what the best weight is for you.  Get at least 30 minutes of exercise that causes your heart to beat faster (aerobic exercise) most days of the week. This may include walking, swimming, or biking.  Get at least 30 minutes of exercise that strengthens your muscles (resistance exercise) at least 3 days a week. This may include lifting weights or pilates.  Do not use any products that contain nicotine or tobacco. This includes cigarettes and e-cigarettes. If you  need help quitting, ask your doctor.  Check your blood pressure at home as told by your doctor.  Keep all follow-up visits as told by your doctor. This is important. Medicines  Take over-the-counter and prescription medicines only as told by your doctor. Follow directions carefully.  Do not skip doses of blood pressure medicine. The medicine does not work as well if you skip doses. Skipping doses also puts you at risk for problems.  Ask your doctor about side effects or reactions to medicines that you should watch for. Contact a doctor if:  You think you are having a reaction to the medicine you are taking.  You have headaches that keep coming back (recurring).  You feel dizzy.  You have swelling in your ankles.  You have trouble with your vision. Get help right away if:  You get a very bad headache.  You start to feel confused.  You feel weak or numb.  You feel faint.  You get very bad pain in your: ? Chest. ? Belly (abdomen).  You throw up (vomit) more than once.  You have trouble breathing.   Summary  Hypertension is another name for high blood pressure.  Making healthy choices can help lower blood pressure. If your blood pressure cannot be controlled with healthy choices, you may need to take medicine. This information is not intended to replace advice given to you by your health care provider. Make sure you discuss any questions you have with your health care provider. Document Released: 04/04/2008 Document Revised: 09/14/2016 Document Reviewed: 09/14/2016 Elsevier Interactive Patient Education  2018 Elsevier Inc.  

## 2018-09-18 NOTE — Progress Notes (Signed)
Subjective:    Patient ID: Hannah Allen, female    DOB: 10-01-1966, 52 y.o.   MRN: [id number]  Chief Complaint:  ER follow up   HPI: Hannah Allen is a 52 y.o. female presenting on 09/18/2018 for ER follow up  Pt presents today for follow up after discharged from [facility] Emergency Department. Pt was seen there on 09/15/18 for hypertension and abdominal pain with nausea. Pts BP during this visit was 199/98. She had a complete abdominal work up, which was negative and a hypertensive work up. Her EKG was unremarkable and her BUN was elevated at 22. Other labs and diagnostic studies were unremarkable. Pt states they did not place her on any hypertensive medications and told her to follow up with her PCP for further treatment. Pt states she continues to have an elevated blood pressure: last three from home: 186/107, 168/98, 162/91. She reports intermittent headaches, blurred vision, and leg swelling. She denies unilateral weakness, focal neuro deficits, confusion, slurred speech, or facial drooping. She denies chest pain, palpitations, or shortness of breath.  She reports she was supposed to be taking Pravachol, but stopped taking this because she does not like to take medications. Pt aware of the importance of compliance with this medication.   Relevant past medical, surgical, family, and social history reviewed and updated as indicated.  Allergies and medications reviewed and updated.   Past Medical History:  Diagnosis Date  . Allergy   . Anxiety   . Hyperlipidemia   . Varicose veins     Past Surgical History:  Procedure Laterality Date  . CHOLECYSTECTOMY    . TUBAL LIGATION      Social History   Socioeconomic History  . Marital status: Single    Spouse name: Not on file  . Number of children: Not on file  . Years of education: Not on file  . Highest education level: Not on file  Occupational History  . Not on file  Social Needs  . Financial resource strain: Not on  file  . Food insecurity:    Worry: Not on file    Inability: Not on file  . Transportation needs:    Medical: Not on file    Non-medical: Not on file  Tobacco Use  . Smoking status: Former Smoker    Types: Cigarettes    Last attempt to quit: 08/11/2011    Years since quitting: 7.1  . Smokeless tobacco: Never Used  . Tobacco comment: quit 6 months  Substance and Sexual Activity  . Alcohol use: No  . Drug use: No  . Sexual activity: Not on file  Lifestyle  . Physical activity:    Days per week: Not on file    Minutes per session: Not on file  . Stress: Not on file  Relationships  . Social connections:    Talks on phone: Not on file    Gets together: Not on file    Attends religious service: Not on file    Active member of club or organization: Not on file    Attends meetings of clubs or organizations: Not on file    Relationship status: Not on file  . Intimate partner violence:    Fear of current or ex partner: Not on file    Emotionally abused: Not on file    Physically abused: Not on file    Forced sexual activity: Not on file  Other Topics Concern  . Not on file  Social History Narrative  .  Not on file    Outpatient Encounter Medications as of 09/18/2018  Medication Sig  . chlorthalidone (HYGROTON) 25 MG tablet Take 1 tablet (25 mg total) by mouth daily.  Marland Kitchen lisinopril (PRINIVIL,ZESTRIL) 10 MG tablet Take 1 tablet (10 mg total) by mouth daily.  . pravastatin (PRAVACHOL) 40 MG tablet Take 1 tablet (40 mg total) by mouth daily.  . [DISCONTINUED] fluticasone (FLONASE) 50 MCG/ACT nasal spray Place 2 sprays into both nostrils daily.  . [DISCONTINUED] mupirocin ointment (BACTROBAN) 2 % Use twice a day on affected areas  . [DISCONTINUED] pravastatin (PRAVACHOL) 40 MG tablet Take 1 tablet (40 mg total) by mouth daily. (Patient not taking: Reported on 09/18/2018)   No facility-administered encounter medications on file as of 09/18/2018.     No Known Allergies  Review of  Systems  Constitutional: Positive for fatigue. Negative for chills and fever.  HENT: Negative for voice change.   Eyes: Positive for photophobia and visual disturbance.  Respiratory: Negative for cough, chest tightness and shortness of breath.   Cardiovascular: Positive for leg swelling. Negative for chest pain and palpitations.  Gastrointestinal: Positive for nausea (intermittent ). Negative for abdominal pain.  Genitourinary: Negative for decreased urine volume.  Neurological: Positive for headaches. Negative for dizziness, tremors, seizures, syncope, facial asymmetry, speech difficulty, weakness, light-headedness and numbness.  Psychiatric/Behavioral: Negative for confusion.  All other systems reviewed and are negative.       Objective:    BP (!) 149/77   Pulse 64   Temp (!) 97 F (36.1 C) (Oral)   Ht _0  (1.626 m)   Wt 241 lb (109.3 kg)   BMI 41.37 kg/m    Wt Readings from Last 3 Encounters:  09/18/18 241 lb (109.3 kg)  05/31/18 247 lb (112 kg)  05/16/18 244 lb (110.7 kg)    Physical Exam  Constitutional: She is oriented to person, place, and time. She appears well-developed and well-nourished. She is cooperative. No distress.  HENT:  Head: Normocephalic.  Right Ear: Hearing, tympanic membrane, external ear and ear canal normal.  Left Ear: Hearing, tympanic membrane, external ear and ear canal normal.  Nose: Nose normal.  Mouth/Throat: Uvula is midline, oropharynx is clear and moist and mucous membranes are normal. No tonsillar exudate.  Eyes: Pupils are equal, round, and reactive to light. Conjunctivae, EOM and lids are normal.  Fundoscopic exam:      The right eye shows no AV nicking, no hemorrhage and no papilledema. The right eye shows red reflex.       The left eye shows no AV nicking, no hemorrhage and no papilledema. The left eye shows red reflex.  Neck: Trachea normal, normal range of motion and phonation normal. Neck supple. No JVD present. Carotid bruit is  not present. No thyroid mass and no thyromegaly present.  Cardiovascular: Normal rate, regular rhythm, normal heart sounds and intact distal pulses. PMI is not displaced. Exam reveals no gallop and no friction rub.  No murmur heard. Pulmonary/Chest: Effort normal and breath sounds normal. No respiratory distress.  Abdominal: Soft. Normal appearance and normal aorta. There is no hepatosplenomegaly. There is no tenderness.  Neurological: She is alert and oriented to person, place, and time. She has normal strength and normal reflexes. No cranial nerve deficit or sensory deficit.  Skin: Skin is warm and dry. Capillary refill takes less than 2 seconds.  Psychiatric: She has a normal mood and affect. Her speech is normal and behavior is normal. Judgment and thought content normal. Cognition  and memory are normal.  Nursing note and vitals reviewed.   Results for orders placed or performed in visit on 05/31/18  [facility]  Result Value Ref Range   Glucose 103 (H) 65 - 99 mg/dL   BUN 18 6 - 24 mg/dL   Creatinine, Ser 0.77 0.57 - 1.00 mg/dL   GFR calc non Af Amer 88 >59 mL/min/1.73   GFR calc Af Amer 102 >59 mL/min/1.73   BUN/Creatinine Ratio 23 9 - 23   Sodium 138 134 - 144 mmol/L   Potassium 4.2 3.5 - 5.2 mmol/L   Chloride 99 96 - 106 mmol/L   CO2 25 20 - 29 mmol/L   Calcium 9.3 8.7 - 10.2 mg/dL  CBC with Differential/Platelet  Result Value Ref Range   WBC 4.0 3.4 - 10.8 x10E3/uL   RBC 4.19 3.77 - 5.28 x10E6/uL   Hemoglobin 11.9 11.1 - 15.9 g/dL   Hematocrit 37.2 34.0 - 46.6 %   MCV 89 79 - 97 fL   MCH 28.4 26.6 - 33.0 pg   MCHC 32.0 31.5 - 35.7 g/dL   RDW 13.8 12.3 - 15.4 %   Platelets 226 150 - 450 x10E3/uL   Neutrophils 46 Not Estab. %   Lymphs 43 Not Estab. %   Monocytes 8 Not Estab. %   Eos 2 Not Estab. %   Basos 1 Not Estab. %   Neutrophils Absolute 1.9 1.4 - 7.0 x10E3/uL   Lymphocytes Absolute 1.7 0.7 - 3.1 x10E3/uL   Monocytes Absolute 0.3 0.1 - 0.9 x10E3/uL   EOS  (ABSOLUTE) 0.1 0.0 - 0.4 x10E3/uL   Basophils Absolute 0.0 0.0 - 0.2 x10E3/uL   Immature Granulocytes 0 Not Estab. %   Immature Grans (Abs) 0.0 0.0 - 0.1 x10E3/uL  Lipid panel  Result Value Ref Range   Cholesterol, Total 229 (H) 100 - 199 mg/dL   Triglycerides 151 (H) 0 - 149 mg/dL   HDL 54 >39 mg/dL   VLDL Cholesterol Cal 30 5 - 40 mg/dL   LDL Calculated 145 (H) 0 - 99 mg/dL   Chol/HDL Ratio 4.2 0.0 - 4.4 ratio   Labs from [facility] on 09/15/18 reviewed:  Na 142 K 4.1 CL 105 BUN 22 Creatinine 0.68 GFR > 60 EKG NSR  Pertinent labs & imaging results that were available during my care of the patient were reviewed by me and considered in my medical decision making.  Assessment & Plan:  Hannah Allen was seen today for er follow up.  Diagnoses and all orders for this visit:  Essential hypertension New diagnosis. Will start on dual therapy due to BP readings at home and in the Emergency Department over 180/90. Diet and exercise encouraged. Weight management discussed. DASH diet information provided. Pt will continue to monitor BP at home and report any extremes. Pt to return to office for worsening or new symptoms. Pt to return to office in 2 weeks for reevaluation and lab work.  -     lisinopril (PRINIVIL,ZESTRIL) 10 MG tablet; Take 1 tablet (10 mg total) by mouth daily. -     chlorthalidone (HYGROTON) 25 MG tablet; Take 1 tablet (25 mg total) by mouth daily.  Mixed hyperlipidemia Pt had stopped taking pravastatin, pt agrees to restart medications as prescribed. Pt aware to return to office in 2 weeks for reevaluation and lab work.  -     pravastatin (PRAVACHOL) 40 MG tablet; Take 1 tablet (40 mg total) by mouth daily.   Morbid Obesity ([place]) Diet and  exercise encouraged. Plans to join [employer]. Weight management discussed.    Continue all other maintenance medications.  Follow up plan: Return in about 2 weeks (around 10/02/2018), or if symptoms worsen or fail to  improve.  Educational handout given for Hypertension and DASH Diet  The above assessment and management plan was discussed with the patient. The patient verbalized understanding of and has agreed to the management plan. Patient is aware to call the clinic if symptoms persist or worsen. Patient is aware when to return to the clinic for a follow-up visit. Patient educated on when it is appropriate to go to the emergency department.   Monia Pouch, FNP-C [facility] [phone]

## 2018-09-24 ENCOUNTER — Encounter: Payer: Self-pay | Admitting: Pediatrics

## 2018-09-25 ENCOUNTER — Ambulatory Visit (INDEPENDENT_AMBULATORY_CARE_PROVIDER_SITE_OTHER): Payer: Managed Care, Other (non HMO) | Admitting: Physician Assistant

## 2018-09-25 ENCOUNTER — Encounter: Payer: Self-pay | Admitting: Physician Assistant

## 2018-09-25 VITALS — BP 115/77 | HR 67 | Temp 97.2°F | Ht 64.0 in | Wt 237.0 lb

## 2018-09-25 DIAGNOSIS — R6884 Jaw pain: Secondary | ICD-10-CM

## 2018-09-25 DIAGNOSIS — I10 Essential (primary) hypertension: Secondary | ICD-10-CM | POA: Diagnosis not present

## 2018-09-25 NOTE — Progress Notes (Signed)
BP 115/77   Pulse 67   Temp (!) 97.2 F (36.2 C) (Oral)   Ht _0  (1.626 m)   Wt 237 lb (107.5 kg)   BMI 40.68 kg/m    Subjective:    Patient ID: Hannah Allen, female    DOB: [birth year], 52 y.o.   MRN: [id number]  HPI: Hannah Allen is a 52 y.o. female presenting on 09/25/2018 for Hypotension; Jaw Pain; and Dizziness  She comes in for recheck pn her BP and has had low readings and feels extremely drained. She had it start just a day after starting the medications.  She had really high levels at the ED.  She denies chest pain or shortness of breath. Has some back pain related to her physical work and activity.   Past Medical History:  Diagnosis Date  . Allergy   . Anxiety   . Hyperlipidemia   . Varicose veins    Relevant past medical, surgical, family and social history reviewed and updated as indicated. Interim medical history since our last visit reviewed. Allergies and medications reviewed and updated. DATA REVIEWED: CHART IN EPIC  Family History reviewed for pertinent findings.  Review of Systems  Constitutional: Positive for fatigue. Negative for diaphoresis and fever.  HENT: Negative.   Eyes: Negative.   Respiratory: Negative.  Negative for cough, choking, chest tightness, shortness of breath and wheezing.   Cardiovascular: Negative for chest pain, palpitations and leg swelling.  Gastrointestinal: Positive for nausea.  Genitourinary: Negative.   Musculoskeletal: Positive for arthralgias, back pain and myalgias.    Allergies as of 09/25/2018   No Known Allergies     Medication List        Accurate as of 09/25/18 10:11 AM. Always use your most recent med list.          chlorthalidone 25 MG tablet Commonly known as:  HYGROTON Take 1 tablet (25 mg total) by mouth daily.   lisinopril 10 MG tablet Commonly known as:  PRINIVIL,ZESTRIL Take 1 tablet (10 mg total) by mouth daily.   pravastatin 40 MG tablet Commonly known as:  PRAVACHOL Take 1 tablet (40  mg total) by mouth daily.          Objective:    BP 115/77   Pulse 67   Temp (!) 97.2 F (36.2 C) (Oral)   Ht _1  (1.626 m)   Wt 237 lb (107.5 kg)   BMI 40.68 kg/m   No Known Allergies  Wt Readings from Last 3 Encounters:  09/25/18 237 lb (107.5 kg)  09/18/18 241 lb (109.3 kg)  05/31/18 247 lb (112 kg)    Physical Exam  Constitutional: She is oriented to person, place, and time. She appears well-developed and well-nourished.  HENT:  Head: Normocephalic and atraumatic.  Eyes: Pupils are equal, round, and reactive to light. Conjunctivae and EOM are normal.  Cardiovascular: Normal rate, regular rhythm, normal heart sounds and intact distal pulses.  Pulmonary/Chest: Effort normal and breath sounds normal.  Abdominal: Soft. Bowel sounds are normal.  Neurological: She is alert and oriented to person, place, and time. She has normal reflexes.  Skin: Skin is warm and dry. No rash noted.  Psychiatric: She has a normal mood and affect. Her behavior is normal. Judgment and thought content normal.    Results for orders placed or performed in visit on 05/31/18  [facility]  Result Value Ref Range   Glucose 103 (H) 65 - 99 mg/dL   BUN 18 6 - 24 mg/dL  Creatinine, Ser 0.77 0.57 - 1.00 mg/dL   GFR calc non Af Amer 88 >59 mL/min/1.73   GFR calc Af Amer 102 >59 mL/min/1.73   BUN/Creatinine Ratio 23 9 - 23   Sodium 138 134 - 144 mmol/L   Potassium 4.2 3.5 - 5.2 mmol/L   Chloride 99 96 - 106 mmol/L   CO2 25 20 - 29 mmol/L   Calcium 9.3 8.7 - 10.2 mg/dL  CBC with Differential/Platelet  Result Value Ref Range   WBC 4.0 3.4 - 10.8 x10E3/uL   RBC 4.19 3.77 - 5.28 x10E6/uL   Hemoglobin 11.9 11.1 - 15.9 g/dL   Hematocrit 37.2 34.0 - 46.6 %   MCV 89 79 - 97 fL   MCH 28.4 26.6 - 33.0 pg   MCHC 32.0 31.5 - 35.7 g/dL   RDW 13.8 12.3 - 15.4 %   Platelets 226 150 - 450 x10E3/uL   Neutrophils 46 Not Estab. %   Lymphs 43 Not Estab. %   Monocytes 8 Not Estab. %   Eos 2 Not Estab. %    Basos 1 Not Estab. %   Neutrophils Absolute 1.9 1.4 - 7.0 x10E3/uL   Lymphocytes Absolute 1.7 0.7 - 3.1 x10E3/uL   Monocytes Absolute 0.3 0.1 - 0.9 x10E3/uL   EOS (ABSOLUTE) 0.1 0.0 - 0.4 x10E3/uL   Basophils Absolute 0.0 0.0 - 0.2 x10E3/uL   Immature Granulocytes 0 Not Estab. %   Immature Grans (Abs) 0.0 0.0 - 0.1 x10E3/uL  Lipid panel  Result Value Ref Range   Cholesterol, Total 229 (H) 100 - 199 mg/dL   Triglycerides 151 (H) 0 - 149 mg/dL   HDL 54 >39 mg/dL   VLDL Cholesterol Cal 30 5 - 40 mg/dL   LDL Calculated 145 (H) 0 - 99 mg/dL   Chol/HDL Ratio 4.2 0.0 - 4.4 ratio      Assessment & Plan:   1. Essential hypertension - CMP14+EGFR - CBC with Differential/Platelet - TSH  2. Jaw pain Checking labs   Continue all other maintenance medications as listed above.  Follow up plan: Return for keep 12/12 appointment.  Educational handout given for [place] PA-C [place] [address]  [place], [place] [address] [phone]   09/25/2018, 10:11 AM

## 2018-09-26 LAB — CBC WITH DIFFERENTIAL/PLATELET
BASOS ABS: 0.1 10*3/uL (ref 0.0–0.2)
Basos: 1 %
EOS (ABSOLUTE): 0.1 10*3/uL (ref 0.0–0.4)
Eos: 2 %
HEMATOCRIT: 39 % (ref 34.0–46.6)
HEMOGLOBIN: 13.2 g/dL (ref 11.1–15.9)
IMMATURE GRANS (ABS): 0 10*3/uL (ref 0.0–0.1)
Immature Granulocytes: 0 %
LYMPHS ABS: 2.6 10*3/uL (ref 0.7–3.1)
Lymphs: 49 %
MCH: 29.5 pg (ref 26.6–33.0)
MCHC: 33.8 g/dL (ref 31.5–35.7)
MCV: 87 fL (ref 79–97)
MONOS ABS: 0.5 10*3/uL (ref 0.1–0.9)
Monocytes: 9 %
Neutrophils Absolute: 2.1 10*3/uL (ref 1.4–7.0)
Neutrophils: 39 %
Platelets: 253 10*3/uL (ref 150–450)
RBC: 4.47 x10E6/uL (ref 3.77–5.28)
RDW: 13.4 % (ref 12.3–15.4)
WBC: 5.3 10*3/uL (ref 3.4–10.8)

## 2018-09-26 LAB — CMP14+EGFR
ALBUMIN: 4.4 g/dL (ref 3.5–5.5)
ALK PHOS: 69 IU/L (ref 39–117)
ALT: 24 IU/L (ref 0–32)
AST: 18 IU/L (ref 0–40)
Albumin/Globulin Ratio: 1.4 (ref 1.2–2.2)
BILIRUBIN TOTAL: 0.6 mg/dL (ref 0.0–1.2)
BUN / CREAT RATIO: 29 — AB (ref 9–23)
BUN: 37 mg/dL — AB (ref 6–24)
CHLORIDE: 98 mmol/L (ref 96–106)
CO2: 22 mmol/L (ref 20–29)
Calcium: 10.1 mg/dL (ref 8.7–10.2)
Creatinine, Ser: 1.29 mg/dL — ABNORMAL HIGH (ref 0.57–1.00)
GFR calc Af Amer: 55 mL/min/{1.73_m2} — ABNORMAL LOW (ref >59)
GFR calc non Af Amer: 47 mL/min/{1.73_m2} — ABNORMAL LOW (ref >59)
GLOBULIN, TOTAL: 3.1 g/dL (ref 1.5–4.5)
Glucose: 100 mg/dL — ABNORMAL HIGH (ref 65–99)
Potassium: 4.7 mmol/L (ref 3.5–5.2)
SODIUM: 138 mmol/L (ref 134–144)
Total Protein: 7.5 g/dL (ref 6.0–8.5)

## 2018-09-26 LAB — TSH: TSH: 1.69 u[IU]/mL (ref 0.450–4.500)

## 2018-10-01 ENCOUNTER — Other Ambulatory Visit: Payer: Self-pay | Admitting: Pediatrics

## 2018-10-02 ENCOUNTER — Ambulatory Visit: Payer: Managed Care, Other (non HMO) | Admitting: Family Medicine

## 2018-10-11 ENCOUNTER — Ambulatory Visit (INDEPENDENT_AMBULATORY_CARE_PROVIDER_SITE_OTHER): Payer: Managed Care, Other (non HMO) | Admitting: Pediatrics

## 2018-10-11 ENCOUNTER — Encounter: Payer: Self-pay | Admitting: Pediatrics

## 2018-10-11 DIAGNOSIS — I10 Essential (primary) hypertension: Secondary | ICD-10-CM

## 2018-10-11 DIAGNOSIS — E782 Mixed hyperlipidemia: Secondary | ICD-10-CM | POA: Diagnosis not present

## 2018-10-11 MED ORDER — AMLODIPINE BESYLATE 5 MG PO TABS: 5.0000 mg | ORAL_TABLET | Freq: Every day | ORAL | 3 refills | Status: DC

## 2018-10-11 MED ORDER — PRAVASTATIN SODIUM 20 MG PO TABS: 20.0000 mg | ORAL_TABLET | Freq: Every day | ORAL | 3 refills | Status: DC

## 2018-10-11 NOTE — Patient Instructions (Signed)
Check blood pressures at home.  Send numbers to me in 1 week.  Take half tablet of the amlodipine.  If top number blood pressure regularly greater than 135, start taking full tablet (5mg ) amlodipine.

## 2018-10-11 NOTE — Progress Notes (Signed)
  Subjective:   Patient ID: Hannah Allen, female    DOB: October 15, 1966, 52 y.o.   MRN: [id number] CC: Blood Pressure Check  HPI: Hannah Allen is a 52 y.o. female   Started on chlorthalidone and lisinopril 3 weeks ago for elevated BPs. Developed dizziness, chlorthalidone stopped. Had slight increase in creatinine up to 1.29 from 0.7.  Episode of feeling like her throat was closing in about a week ago. Woke her up at night. Last several hours. Thought she might not be able to keep breathing. Took benadryl. No previous known allergies to meds or foods. Did not eat anything unusual.   HLD: on statin, some muscle aches on '40mg'$   Relevant past medical, surgical, family and social history reviewed. Allergies and medications reviewed and updated. Social History   Tobacco Use  Smoking Status Former Smoker  . Types: Cigarettes  . Last attempt to quit: 08/11/2011  . Years since quitting: 7.1  Smokeless Tobacco Never Used  Tobacco Comment   quit 6 months   ROS: Per HPI   Objective:    BP 125/76   Pulse 67   Temp (!) 97.1 F (36.2 C) (Oral)   Ht '5\' 4"'$  (1.626 m)   Wt 236 lb 6.4 oz (107.2 kg)   BMI 40.58 kg/m   Wt Readings from Last 3 Encounters:  10/11/18 236 lb 6.4 oz (107.2 kg)  09/25/18 237 lb (107.5 kg)  09/18/18 241 lb (109.3 kg)    Gen: NAD, alert, cooperative with exam, NCAT EYES: EOMI, no conjunctival injection, or no icterus CV: NRRR, normal S1/S2, no murmur, distal pulses 2+ b/l Resp: CTABL, no wheezes, normal WOB.  Ext: No edema, warm Neuro: Alert and oriented MSK: normal muscle bulk  Assessment & Plan:  Hannah Allen was seen today for blood pressure check.  Diagnoses and all orders for this visit:  Essential hypertension Given episode of possible angioedema on lisinopril, will stop, added to allergy list, switch to amlodipine. Send blood pressures in 2 weeks. -     amLODipine (NORVASC) 5 MG tablet; Take 1 tablet (5 mg total) by mouth daily. -     BMP8+EGFR  Mixed  hyperlipidemia Some lower ext aching on higher dose, decrease to '20mg'$ .  -     pravastatin (PRAVACHOL) 20 MG tablet; Take 1 tablet (20 mg total) by mouth daily.   Follow up plan: Return in about 3 months (around 01/10/2019). Assunta Found, MD [place]

## 2018-10-12 LAB — BMP8+EGFR
BUN/Creatinine Ratio: 33 — ABNORMAL HIGH (ref 9–23)
BUN: 27 mg/dL — ABNORMAL HIGH (ref 6–24)
CO2: 24 mmol/L (ref 20–29)
CREATININE: 0.81 mg/dL (ref 0.57–1.00)
Calcium: 9.7 mg/dL (ref 8.7–10.2)
Chloride: 103 mmol/L (ref 96–106)
GFR calc Af Amer: 96 mL/min/{1.73_m2} (ref >59)
GFR calc non Af Amer: 83 mL/min/{1.73_m2} (ref >59)
Glucose: 85 mg/dL (ref 65–99)
Potassium: 4.4 mmol/L (ref 3.5–5.2)
Sodium: 144 mmol/L (ref 134–144)

## 2018-10-15 ENCOUNTER — Telehealth: Payer: Self-pay | Admitting: Pediatrics

## 2018-10-15 NOTE — Telephone Encounter (Signed)
Noted, will close encounter. 

## 2018-10-18 ENCOUNTER — Encounter: Payer: Self-pay | Admitting: Pediatrics

## 2018-10-19 ENCOUNTER — Other Ambulatory Visit: Payer: Self-pay | Admitting: Pediatrics

## 2018-10-19 ENCOUNTER — Telehealth: Payer: Self-pay | Admitting: *Deleted

## 2018-10-19 DIAGNOSIS — I10 Essential (primary) hypertension: Secondary | ICD-10-CM

## 2018-10-19 MED ORDER — AMLODIPINE BESYLATE 10 MG PO TABS: 10.0000 mg | ORAL_TABLET | Freq: Every day | ORAL | 1 refills | Status: AC

## 2018-10-19 NOTE — Telephone Encounter (Signed)
Please review and advise on high blood pressure.  Message sent yesterday and patient is getting upset.

## 2018-10-19 NOTE — Telephone Encounter (Signed)
Are you taking full tab, 5mg  of amlodipine now? We talked about increasing it from half if still having high BPs. If you are already on 5mg , we need to increase you to 10mg .   I will send in refill so you don't run out.   Thank you for sending BP numbers, please send again in a week.

## 2018-10-26 NOTE — Telephone Encounter (Signed)
Left message to call back  

## 2018-10-26 NOTE — Telephone Encounter (Signed)
Spoke with patient she had been taking Amolodipine 5 mg daily but has increased to 10 mg per Dr. Oswaldo DoneVincent.  Will send blood pressure log in through MyChart

## 2018-11-01 ENCOUNTER — Encounter: Payer: Self-pay | Admitting: Pediatrics

## 2018-11-08 ENCOUNTER — Encounter: Payer: Self-pay | Admitting: Pediatrics

## 2020-05-29 LAB — COLOGUARD: COLOGUARD: NEGATIVE

## 2020-05-29 LAB — EXTERNAL GENERIC LAB PROCEDURE: COLOGUARD: NEGATIVE

## 2021-02-15 ENCOUNTER — Other Ambulatory Visit: Payer: Self-pay | Admitting: Physical Medicine and Rehabilitation

## 2021-02-15 ENCOUNTER — Other Ambulatory Visit (HOSPITAL_COMMUNITY): Payer: Self-pay | Admitting: Physical Medicine and Rehabilitation

## 2021-02-15 DIAGNOSIS — M5459 Other low back pain: Secondary | ICD-10-CM

## 2021-02-17 ENCOUNTER — Ambulatory Visit (HOSPITAL_COMMUNITY)
Admission: RE | Admit: 2021-02-17 | Discharge: 2021-02-17 | Disposition: A | Discharge status: Home / Self Care | Payer: PRIVATE HEALTH INSURANCE | Source: Ambulatory Visit | Attending: Physical Medicine and Rehabilitation | Admitting: Physical Medicine and Rehabilitation

## 2021-02-17 ENCOUNTER — Other Ambulatory Visit: Payer: Self-pay

## 2021-02-17 DIAGNOSIS — M5459 Other low back pain: Secondary | ICD-10-CM | POA: Insufficient documentation

## 2021-11-05 ENCOUNTER — Other Ambulatory Visit (HOSPITAL_COMMUNITY): Payer: Self-pay | Admitting: Family Medicine

## 2021-11-05 DIAGNOSIS — Z1231 Encounter for screening mammogram for malignant neoplasm of breast: Secondary | ICD-10-CM

## 2021-11-10 ENCOUNTER — Other Ambulatory Visit: Payer: Self-pay

## 2021-11-10 ENCOUNTER — Ambulatory Visit (HOSPITAL_COMMUNITY)
Admission: RE | Admit: 2021-11-10 | Discharge: 2021-11-10 | Disposition: A | Discharge status: Home / Self Care | Payer: No Typology Code available for payment source | Source: Ambulatory Visit | Attending: Family Medicine | Admitting: Family Medicine

## 2021-11-10 DIAGNOSIS — Z1231 Encounter for screening mammogram for malignant neoplasm of breast: Secondary | ICD-10-CM | POA: Insufficient documentation

## 2021-11-25 NOTE — Progress Notes (Signed)
Sent message, via epic in basket, requesting orders in epic from surgeon.  

## 2021-11-25 NOTE — Patient Instructions (Addendum)
DUE TO COVID-19 ONLY ONE VISITOR IS ALLOWED TO COME WITH YOU AND STAY IN THE WAITING ROOM ONLY DURING PRE OP AND PROCEDURE.   **NO VISITORS ARE ALLOWED IN THE SHORT STAY AREA OR RECOVERY ROOM!!**   You are not required to quarantine, however you are required to wear a well-fitted mask when you are out and around people not in your household.  Hand Hygiene often Do NOT share personal items Notify your provider if you are in close contact with someone who has COVID or you develop fever 100.4 or greater, new onset of sneezing, cough, sore throat, shortness of breath or body aches.   Your procedure is scheduled on: Thursday, 12-02-21   Report to [facility] Main  Entrance     Report to admitting at 5:15 AM   Call this number if you have problems the morning of surgery [phone]   Do not eat food :After Midnight.   May have liquids until 4:30 AM day of surgery  CLEAR LIQUID DIET  Foods Allowed                                                                     Foods Excluded  Water, Black Coffee (no milk/no creamer) and tea, regular and decaf                              liquids that you cannot  Plain Jell-O in any flavor  (No red)                         see through such as: Fruit ices (not with fruit pulp)                                 milk, soups, orange juice  Iced Popsicles (No red)                                    All solid food                             Apple juices Sports drinks like Gatorade (No red) Lightly seasoned clear broth or consume(fat free) Sugar     Complete one G2 drink the morning of surgery at  4:30 AM  the day of surgery.    The day of surgery:  Drink ONE (1) Pre-Surgery G2 the morning of surgery. Drink in one sitting. Do not sip.  This drink was given to you during your hospital  pre-op appointment visit. Nothing else to drink after completing the Pre-Surgery G2        If you have questions, please contact your surgeons office.      Oral Hygiene is also important to reduce your risk of infection.                                    Remember - BRUSH YOUR TEETH THE  MORNING OF SURGERY WITH YOUR REGULAR TOOTHPASTE   Do NOT smoke after Midnight   Take these medicines the morning of surgery with A SIP OF WATER: Amlodipine, Pravastatin   Stop all vitamins and herbal supplements a week before surgery             You may not have any metal on your body including hair pins, jewelry, and body piercing             Do not wear make-up, lotions, powders, perfumes or deodorant  Do not wear nail polish including gel and S&S, artificial/acrylic nails, or any other type of covering on natural nails including finger and toenails. If you have artificial nails, gel coating, etc. that needs to be removed by a nail salon please have this removed prior to surgery or surgery may need to be canceled/ delayed if the surgeon/ anesthesia feels like they are unable to be safely monitored.   Do not shave  48 hours prior to surgery.       Do not bring valuables to the hospital. [facility] IS NOT RESPONSIBLE FOR VALUABLES.   Contacts, dentures or bridgework may not be worn into surgery.   Patients discharged the day of surgery will not be allowed to drive home.   A responsible adult must remain with you for 24 hours after surgery.  Please read over the following fact sheets you were given: IF YOU HAVE QUESTIONS ABOUT YOUR PRE OP INSTRUCTIONS PLEASE CALL [phone] [facility]   Hannah Allen - Preparing for Surgery Before surgery, you can play an important role.  Because skin is not sterile, your skin needs to be as free of germs as possible.  You can reduce the number of germs on your skin by washing with CHG (chlorahexidine gluconate) soap before surgery.  CHG is an antiseptic cleaner which kills germs and bonds with the skin to continue killing germs even after washing. Please DO NOT use if you have an allergy to CHG or antibacterial soaps.  If your  skin becomes reddened/irritated stop using the CHG and inform your nurse when you arrive at Short Stay. Do not shave (including legs and underarms) for at least 48 hours prior to the first CHG shower.  You may shave your face/neck.  Please follow these instructions carefully:  1.  Shower with CHG Soap the night before surgery and the  morning of surgery.  2.  If you choose to wash your hair, wash your hair first as usual with your normal  shampoo.  3.  After you shampoo, rinse your hair and body thoroughly to remove the shampoo.                             4.  Use CHG as you would any other liquid soap.  You can apply chg directly to the skin and wash.  Gently with a scrungie or clean washcloth.  5.  Apply the CHG Soap to your body ONLY FROM THE NECK DOWN.   Do   not use on face/ open                           Wound or open sores. Avoid contact with eyes, ears mouth and   genitals (private parts).                       Wash face,  Genitals (private parts) with your normal soap.             6.  Wash thoroughly, paying special attention to the area where your    surgery  will be performed.  7.  Thoroughly rinse your body with warm water from the neck down.  8.  DO NOT shower/wash with your normal soap after using and rinsing off the CHG Soap.                9.  Pat yourself dry with a clean towel.            10.  Wear clean pajamas.            11.  Place clean sheets on your bed the night of your first shower and do not  sleep with pets. Day of Surgery : Do not apply any lotions/deodorants the morning of surgery.  Please wear clean clothes to the hospital/surgery center.  FAILURE TO FOLLOW THESE INSTRUCTIONS MAY RESULT IN THE CANCELLATION OF YOUR SURGERY  PATIENT SIGNATURE_________________________________  NURSE SIGNATURE__________________________________  ________________________________________________________________________   Hannah Allen  An incentive spirometer is a tool  that can help keep your lungs clear and active. This tool measures how well you are filling your lungs with each breath. Taking long deep breaths may help reverse or decrease the chance of developing breathing (pulmonary) problems (especially infection) following: A long period of time when you are unable to move or be active. BEFORE THE PROCEDURE  If the spirometer includes an indicator to show your best effort, your nurse or respiratory therapist will set it to a desired goal. If possible, sit up straight or lean slightly forward. Try not to slouch. Hold the incentive spirometer in an upright position. INSTRUCTIONS FOR USE  Sit on the edge of your bed if possible, or sit up as far as you can in bed or on a chair. Hold the incentive spirometer in an upright position. Breathe out normally. Place the mouthpiece in your mouth and seal your lips tightly around it. Breathe in slowly and as deeply as possible, raising the piston or the ball toward the top of the column. Hold your breath for 3-5 seconds or for as long as possible. Allow the piston or ball to fall to the bottom of the column. Remove the mouthpiece from your mouth and breathe out normally. Rest for a few seconds and repeat Steps 1 through 7 at least 10 times every 1-2 hours when you are awake. Take your time and take a few normal breaths between deep breaths. The spirometer may include an indicator to show your best effort. Use the indicator as a goal to work toward during each repetition. After each set of 10 deep breaths, practice coughing to be sure your lungs are clear. If you have an incision (the cut made at the time of surgery), support your incision when coughing by placing a pillow or rolled up towels firmly against it. Once you are able to get out of bed, walk around indoors and cough well. You may stop using the incentive spirometer when instructed by your caregiver.  RISKS AND COMPLICATIONS Take your time so you do not get  dizzy or light-headed. If you are in pain, you may need to take or ask for pain medication before doing incentive spirometry. It is harder to take a deep breath if you are having pain. AFTER USE Rest and breathe slowly and easily. It can be helpful to keep track  of a log of your progress. Your caregiver can provide you with a simple table to help with this. If you are using the spirometer at home, follow these instructions: SEEK MEDICAL CARE IF:  You are having difficultly using the spirometer. You have trouble using the spirometer as often as instructed. Your pain medication is not giving enough relief while using the spirometer. You develop fever of 100.5 F (38.1 C) or higher. SEEK IMMEDIATE MEDICAL CARE IF:  You cough up bloody sputum that had not been present before. You develop fever of 102 F (38.9 C) or greater. You develop worsening pain at or near the incision site. MAKE SURE YOU:  Understand these instructions. Will watch your condition. Will get help right away if you are not doing well or get worse. Document Released: 02/27/2007 Document Revised: 01/09/2012 Document Reviewed: 04/30/2007 ExitCare Patient Information 2014 ExitCare, [place].   ________________________________________________________________________  WHAT IS A BLOOD TRANSFUSION? Blood Transfusion Information  A transfusion is the replacement of blood or some of its parts. Blood is made up of multiple cells which provide different functions. Red blood cells carry oxygen and are used for blood loss replacement. White blood cells fight against infection. Platelets control bleeding. Plasma helps clot blood. Other blood products are available for specialized needs, such as hemophilia or other clotting disorders. BEFORE THE TRANSFUSION  Who gives blood for transfusions?  Healthy volunteers who are fully evaluated to make sure their blood is safe. This is blood bank blood. Transfusion therapy is the safest it has  ever been in the practice of medicine. Before blood is taken from a donor, a complete history is taken to make sure that person has no history of diseases nor engages in risky social behavior (examples are intravenous drug use or sexual activity with multiple partners). The donor's travel history is screened to minimize risk of transmitting infections, such as malaria. The donated blood is tested for signs of infectious diseases, such as HIV and hepatitis. The blood is then tested to be sure it is compatible with you in order to minimize the chance of a transfusion reaction. If you or a relative donates blood, this is often done in anticipation of surgery and is not appropriate for emergency situations. It takes many days to process the donated blood. RISKS AND COMPLICATIONS Although transfusion therapy is very safe and saves many lives, the main dangers of transfusion include:  Getting an infectious disease. Developing a transfusion reaction. This is an allergic reaction to something in the blood you were given. Every precaution is taken to prevent this. The decision to have a blood transfusion has been considered carefully by your caregiver before blood is given. Blood is not given unless the benefits outweigh the risks. AFTER THE TRANSFUSION Right after receiving a blood transfusion, you will usually feel much better and more energetic. This is especially true if your red blood cells have gotten low (anemic). The transfusion raises the level of the red blood cells which carry oxygen, and this usually causes an energy increase. The nurse administering the transfusion will monitor you carefully for complications. HOME CARE INSTRUCTIONS  No special instructions are needed after a transfusion. You may find your energy is better. Speak with your caregiver about any limitations on activity for underlying diseases you may have. SEEK MEDICAL CARE IF:  Your condition is not improving after your  transfusion. You develop redness or irritation at the intravenous (IV) site. SEEK IMMEDIATE MEDICAL CARE IF:  Any of the following symptoms  occur over the next 12 hours: Shaking chills. You have a temperature by mouth above 102 F (38.9 C), not controlled by medicine. Chest, back, or muscle pain. People around you feel you are not acting correctly or are confused. Shortness of breath or difficulty breathing. Dizziness and fainting. You get a rash or develop hives. You have a decrease in urine output. Your urine turns a dark color or changes to pink, red, or brown. Any of the following symptoms occur over the next 10 days: You have a temperature by mouth above 102 F (38.9 C), not controlled by medicine. Shortness of breath. Weakness after normal activity. The white part of the eye turns yellow (jaundice). You have a decrease in the amount of urine or are urinating less often. Your urine turns a dark color or changes to pink, red, or brown. Document Released: 10/14/2000 Document Revised: 01/09/2012 Document Reviewed: 06/02/2008 [facility] Patient Information 2014 [place], [place].  _______________________________________________________________________

## 2021-11-26 ENCOUNTER — Ambulatory Visit: Payer: Self-pay | Admitting: Orthopedic Surgery

## 2021-11-30 ENCOUNTER — Encounter (HOSPITAL_COMMUNITY)
Admission: RE | Admit: 2021-11-30 | Discharge: 2021-11-30 | Disposition: A | Discharge status: Home / Self Care | Payer: No Typology Code available for payment source | Source: Ambulatory Visit | Attending: Orthopedic Surgery | Admitting: Orthopedic Surgery

## 2021-11-30 ENCOUNTER — Other Ambulatory Visit: Payer: Self-pay

## 2021-11-30 ENCOUNTER — Encounter (HOSPITAL_COMMUNITY): Payer: Self-pay

## 2021-11-30 VITALS — BP 129/91 | HR 71 | Temp 99.0°F | Resp 18 | Ht 63.0 in | Wt 224.2 lb

## 2021-11-30 DIAGNOSIS — I2510 Atherosclerotic heart disease of native coronary artery without angina pectoris: Secondary | ICD-10-CM | POA: Diagnosis not present

## 2021-11-30 DIAGNOSIS — R7303 Prediabetes: Secondary | ICD-10-CM | POA: Insufficient documentation

## 2021-11-30 DIAGNOSIS — Z01818 Encounter for other preprocedural examination: Secondary | ICD-10-CM | POA: Insufficient documentation

## 2021-11-30 DIAGNOSIS — Z20822 Contact with and (suspected) exposure to covid-19: Secondary | ICD-10-CM | POA: Insufficient documentation

## 2021-11-30 HISTORY — DX: Gastro-esophageal reflux disease without esophagitis: K21.9

## 2021-11-30 HISTORY — DX: Prediabetes: R73.03

## 2021-11-30 HISTORY — DX: Headache, unspecified: R51.9

## 2021-11-30 HISTORY — DX: Essential (primary) hypertension: I10

## 2021-11-30 HISTORY — DX: Hypothyroidism, unspecified: E03.9

## 2021-11-30 HISTORY — DX: Unspecified osteoarthritis, unspecified site: M19.90

## 2021-11-30 LAB — CBC
HCT: 41.4 % (ref 36.0–46.0)
Hemoglobin: 13.7 g/dL (ref 12.0–15.0)
MCH: 28.3 pg (ref 26.0–34.0)
MCHC: 33.1 g/dL (ref 30.0–36.0)
MCV: 85.5 fL (ref 80.0–100.0)
Platelets: 332 10*3/uL (ref 150–400)
RBC: 4.84 MIL/uL (ref 3.87–5.11)
RDW: 13.5 % (ref 11.5–15.5)
WBC: 8.3 10*3/uL (ref 4.0–10.5)
nRBC: 0 % (ref 0.0–0.2)

## 2021-11-30 LAB — BASIC METABOLIC PANEL
Anion gap: 9 (ref 5–15)
BUN: 22 mg/dL — ABNORMAL HIGH (ref 6–20)
CO2: 29 mmol/L (ref 22–32)
Calcium: 9.6 mg/dL (ref 8.9–10.3)
Chloride: 98 mmol/L (ref 98–111)
Creatinine, Ser: 0.62 mg/dL (ref 0.44–1.00)
GFR, Estimated: >60 mL/min (ref >60)
Glucose, Bld: 84 mg/dL (ref 70–99)
Potassium: 3.2 mmol/L — ABNORMAL LOW (ref 3.5–5.1)
Sodium: 136 mmol/L (ref 135–145)

## 2021-11-30 LAB — SURGICAL PCR SCREEN
MRSA, PCR: NEGATIVE
Staphylococcus aureus: POSITIVE — AB

## 2021-11-30 LAB — GLUCOSE, CAPILLARY: Glucose-Capillary: 90 mg/dL (ref 70–99)

## 2021-11-30 LAB — SARS CORONAVIRUS 2 (TAT 6-24 HRS): SARS Coronavirus 2: NEGATIVE

## 2021-11-30 NOTE — Progress Notes (Signed)
PCR results sent to Dr. Swinteck to review.   

## 2021-11-30 NOTE — Progress Notes (Addendum)
COVID swab appointment: N/A  COVID Vaccine Completed:  No Date COVID Vaccine completed: Has received booster: COVID vaccine manufacturer: Pfizer    [employer]   Date of COVID positive in last 90 days:  No  PCP - Franco Nones, MD Cardiologist - N/A  Chest x-ray - N/A EKG - 11-05-21 on chart Stress Test - greater than 2 years ECHO - N/A Cardiac Cath - N/A Pacemaker/ICD device last checked: Spinal Cord Stimulator:  Sleep Study - N/A CPAP -   Fasting Blood Sugar - prediabetic Checks Blood Sugar - does not check   Blood Thinner Instructions: N/A Aspirin Instructions: Last Dose:  Activity level:   Can go up a flight of stairs and perform activities of daily living without stopping and without symptoms of chest pain or shortness of breath.  Some limitations due to hip pain    Anesthesia review:  Patient has had a dry cough x3 days with hoarseness.  Temp 99.0 at PAT.  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  Covid test completed per [facility], PA-C,  results negative  Patient denies shortness of breath, fever,  and chest pain at PAT appointment.  Patient verbalized understanding of instructions that were given to them at the PAT appointment. Patient was also instructed that they will need to review over the PAT instructions again at home before surgery.

## 2021-12-02 ENCOUNTER — Ambulatory Visit (HOSPITAL_COMMUNITY): Payer: No Typology Code available for payment source | Admitting: Physician Assistant

## 2021-12-02 ENCOUNTER — Ambulatory Visit (HOSPITAL_COMMUNITY): Payer: No Typology Code available for payment source

## 2021-12-02 ENCOUNTER — Encounter (HOSPITAL_COMMUNITY)
Admission: RE | Disposition: A | Discharge status: Home / Self Care | Payer: Self-pay | Source: Home / Self Care | Attending: Orthopedic Surgery

## 2021-12-02 ENCOUNTER — Other Ambulatory Visit: Payer: Self-pay

## 2021-12-02 ENCOUNTER — Ambulatory Visit (HOSPITAL_COMMUNITY): Payer: No Typology Code available for payment source | Admitting: Registered Nurse

## 2021-12-02 ENCOUNTER — Ambulatory Visit (HOSPITAL_COMMUNITY)
Admission: RE | Admit: 2021-12-02 | Discharge: 2021-12-02 | Disposition: A | Discharge status: Home / Self Care | Payer: No Typology Code available for payment source | Attending: Orthopedic Surgery | Admitting: Orthopedic Surgery

## 2021-12-02 ENCOUNTER — Encounter (HOSPITAL_COMMUNITY): Payer: Self-pay | Admitting: Orthopedic Surgery

## 2021-12-02 DIAGNOSIS — Z6841 Body Mass Index (BMI) 40.0 and over, adult: Secondary | ICD-10-CM | POA: Insufficient documentation

## 2021-12-02 DIAGNOSIS — K219 Gastro-esophageal reflux disease without esophagitis: Secondary | ICD-10-CM | POA: Insufficient documentation

## 2021-12-02 DIAGNOSIS — E039 Hypothyroidism, unspecified: Secondary | ICD-10-CM | POA: Diagnosis not present

## 2021-12-02 DIAGNOSIS — I10 Essential (primary) hypertension: Secondary | ICD-10-CM | POA: Diagnosis not present

## 2021-12-02 DIAGNOSIS — Z87891 Personal history of nicotine dependence: Secondary | ICD-10-CM | POA: Diagnosis not present

## 2021-12-02 DIAGNOSIS — R262 Difficulty in walking, not elsewhere classified: Secondary | ICD-10-CM | POA: Diagnosis not present

## 2021-12-02 DIAGNOSIS — Z419 Encounter for procedure for purposes other than remedying health state, unspecified: Secondary | ICD-10-CM

## 2021-12-02 DIAGNOSIS — Z09 Encounter for follow-up examination after completed treatment for conditions other than malignant neoplasm: Secondary | ICD-10-CM

## 2021-12-02 DIAGNOSIS — M1611 Unilateral primary osteoarthritis, right hip: Secondary | ICD-10-CM | POA: Diagnosis present

## 2021-12-02 HISTORY — PX: TOTAL HIP ARTHROPLASTY: SHX124

## 2021-12-02 LAB — TYPE AND SCREEN
ABO/RH(D): A POS
Antibody Screen: NEGATIVE

## 2021-12-02 LAB — ABO/RH: ABO/RH(D): A POS

## 2021-12-02 SURGERY — ARTHROPLASTY, HIP, TOTAL, ANTERIOR APPROACH
Anesthesia: Spinal | Site: Hip | Laterality: Right

## 2021-12-02 MED ORDER — FENTANYL CITRATE PF 50 MCG/ML IJ SOSY
PREFILLED_SYRINGE | INTRAMUSCULAR | Status: AC
  Filled 2021-12-02: qty 3

## 2021-12-02 MED ORDER — SODIUM CHLORIDE 0.9 % IV SOLN: INTRAVENOUS | Status: DC

## 2021-12-02 MED ORDER — PHENYLEPHRINE HCL-NACL 20-0.9 MG/250ML-% IV SOLN
INTRAVENOUS | Status: DC | PRN
  Administered 2021-12-02: 25 ug/min via INTRAVENOUS

## 2021-12-02 MED ORDER — ACETAMINOPHEN 10 MG/ML IV SOLN
1000.0000 mg | Freq: Once | INTRAVENOUS | Status: DC | PRN
  Administered 2021-12-02: 1000 mg via INTRAVENOUS

## 2021-12-02 MED ORDER — HYDROCODONE-ACETAMINOPHEN 5-325 MG PO TABS: 1.0000 | ORAL_TABLET | ORAL | Status: DC | PRN

## 2021-12-02 MED ORDER — LACTATED RINGERS IV BOLUS
500.0000 mL | Freq: Once | INTRAVENOUS | Status: AC
  Administered 2021-12-02: 500 mL via INTRAVENOUS

## 2021-12-02 MED ORDER — KETOROLAC TROMETHAMINE 15 MG/ML IJ SOLN
15.0000 mg | Freq: Four times a day (QID) | INTRAMUSCULAR | Status: DC
  Administered 2021-12-02: 15 mg via INTRAVENOUS

## 2021-12-02 MED ORDER — FENTANYL CITRATE (PF) 100 MCG/2ML IJ SOLN
INTRAMUSCULAR | Status: AC
  Filled 2021-12-02: qty 2

## 2021-12-02 MED ORDER — LACTATED RINGERS IV BOLUS
250.0000 mL | Freq: Once | INTRAVENOUS | Status: AC
  Administered 2021-12-02: 250 mL via INTRAVENOUS

## 2021-12-02 MED ORDER — KETOROLAC TROMETHAMINE 30 MG/ML IJ SOLN
INTRAMUSCULAR | Status: DC | PRN
  Administered 2021-12-02: 30 mg

## 2021-12-02 MED ORDER — PROPOFOL 500 MG/50ML IV EMUL
INTRAVENOUS | Status: DC | PRN
  Administered 2021-12-02: 40 ug/kg/min via INTRAVENOUS

## 2021-12-02 MED ORDER — GLYCOPYRROLATE 0.2 MG/ML IJ SOLN
INTRAMUSCULAR | Status: AC
  Filled 2021-12-02: qty 1

## 2021-12-02 MED ORDER — 0.9 % SODIUM CHLORIDE (POUR BTL) OPTIME
TOPICAL | Status: DC | PRN
  Administered 2021-12-02: 300 mL

## 2021-12-02 MED ORDER — KETOROLAC TROMETHAMINE 30 MG/ML IJ SOLN
INTRAMUSCULAR | Status: AC
  Filled 2021-12-02: qty 1

## 2021-12-02 MED ORDER — FENTANYL CITRATE PF 50 MCG/ML IJ SOSY
25.0000 ug | PREFILLED_SYRINGE | INTRAMUSCULAR | Status: DC | PRN
  Administered 2021-12-02 (×3): 50 ug via INTRAVENOUS

## 2021-12-02 MED ORDER — POVIDONE-IODINE 10 % EX SWAB
2.0000 "application " | Freq: Once | CUTANEOUS | Status: AC
  Administered 2021-12-02: 2 via TOPICAL

## 2021-12-02 MED ORDER — HYDROMORPHONE HCL 1 MG/ML IJ SOLN
0.2500 mg | INTRAMUSCULAR | Status: DC | PRN
  Administered 2021-12-02: 0.5 mg via INTRAVENOUS

## 2021-12-02 MED ORDER — FENTANYL CITRATE (PF) 100 MCG/2ML IJ SOLN
INTRAMUSCULAR | Status: DC | PRN
  Administered 2021-12-02: 50 ug via INTRAVENOUS
  Administered 2021-12-02 (×2): 25 ug via INTRAVENOUS

## 2021-12-02 MED ORDER — DEXAMETHASONE SODIUM PHOSPHATE 4 MG/ML IJ SOLN
INTRAMUSCULAR | Status: DC | PRN
  Administered 2021-12-02: 8 mg via INTRAVENOUS

## 2021-12-02 MED ORDER — HYDROMORPHONE HCL 1 MG/ML IJ SOLN
INTRAMUSCULAR | Status: AC
  Filled 2021-12-02: qty 2

## 2021-12-02 MED ORDER — ONDANSETRON HCL 4 MG/2ML IJ SOLN
INTRAMUSCULAR | Status: DC | PRN
  Administered 2021-12-02: 4 mg via INTRAVENOUS

## 2021-12-02 MED ORDER — GLYCOPYRROLATE 0.2 MG/ML IJ SOLN
INTRAMUSCULAR | Status: DC | PRN
  Administered 2021-12-02: .1 mg via INTRAVENOUS

## 2021-12-02 MED ORDER — METHOCARBAMOL 500 MG IVPB - SIMPLE MED
500.0000 mg | Freq: Four times a day (QID) | INTRAVENOUS | Status: DC | PRN
  Administered 2021-12-02: 500 mg via INTRAVENOUS

## 2021-12-02 MED ORDER — PHENYLEPHRINE HCL (PRESSORS) 10 MG/ML IV SOLN
INTRAVENOUS | Status: AC
  Filled 2021-12-02: qty 1

## 2021-12-02 MED ORDER — KETOROLAC TROMETHAMINE 15 MG/ML IJ SOLN
INTRAMUSCULAR | Status: AC
  Filled 2021-12-02: qty 1

## 2021-12-02 MED ORDER — DEXAMETHASONE SODIUM PHOSPHATE 10 MG/ML IJ SOLN
INTRAMUSCULAR | Status: AC
  Filled 2021-12-02: qty 1

## 2021-12-02 MED ORDER — LACTATED RINGERS IV SOLN: INTRAVENOUS | Status: DC

## 2021-12-02 MED ORDER — DOCUSATE SODIUM 100 MG PO CAPS: 100.0000 mg | ORAL_CAPSULE | Freq: Two times a day (BID) | ORAL | 1 refills | Status: AC

## 2021-12-02 MED ORDER — ORAL CARE MOUTH RINSE: 15.0000 mL | Freq: Once | OROMUCOSAL | Status: AC

## 2021-12-02 MED ORDER — CEFAZOLIN SODIUM-DEXTROSE 2-4 GM/100ML-% IV SOLN
INTRAVENOUS | Status: AC
  Filled 2021-12-02: qty 100

## 2021-12-02 MED ORDER — MELOXICAM 15 MG PO TABS: 15.0000 mg | ORAL_TABLET | Freq: Every day | ORAL | 3 refills | Status: DC

## 2021-12-02 MED ORDER — CEFAZOLIN SODIUM-DEXTROSE 2-4 GM/100ML-% IV SOLN
2.0000 g | Freq: Four times a day (QID) | INTRAVENOUS | Status: DC
  Administered 2021-12-02: 2 g via INTRAVENOUS

## 2021-12-02 MED ORDER — HYDROCODONE-ACETAMINOPHEN 5-325 MG PO TABS: 1.0000 | ORAL_TABLET | ORAL | 0 refills | Status: DC | PRN

## 2021-12-02 MED ORDER — CHLORHEXIDINE GLUCONATE 0.12 % MT SOLN
15.0000 mL | Freq: Once | OROMUCOSAL | Status: AC
  Administered 2021-12-02: 15 mL via OROMUCOSAL

## 2021-12-02 MED ORDER — AMISULPRIDE (ANTIEMETIC) 5 MG/2ML IV SOLN: 10.0000 mg | Freq: Once | INTRAVENOUS | Status: DC | PRN

## 2021-12-02 MED ORDER — ONDANSETRON HCL 4 MG PO TABS
4.0000 mg | ORAL_TABLET | Freq: Four times a day (QID) | ORAL | Status: DC | PRN
  Filled 2021-12-02: qty 1

## 2021-12-02 MED ORDER — ONDANSETRON HCL 4 MG PO TABS: 4.0000 mg | ORAL_TABLET | Freq: Three times a day (TID) | ORAL | 0 refills | Status: DC | PRN

## 2021-12-02 MED ORDER — BUPIVACAINE-EPINEPHRINE 0.25% -1:200000 IJ SOLN
INTRAMUSCULAR | Status: DC | PRN
  Administered 2021-12-02: 30 mL

## 2021-12-02 MED ORDER — MIDAZOLAM HCL 5 MG/5ML IJ SOLN
INTRAMUSCULAR | Status: DC | PRN
Start: 2021-12-02 — End: 2021-12-02
  Administered 2021-12-02: 2 mg via INTRAVENOUS

## 2021-12-02 MED ORDER — POVIDONE-IODINE 10 % EX SWAB: 2.0000 "application " | Freq: Once | CUTANEOUS | Status: DC

## 2021-12-02 MED ORDER — PROPOFOL 1000 MG/100ML IV EMUL
INTRAVENOUS | Status: AC
  Filled 2021-12-02: qty 100

## 2021-12-02 MED ORDER — METHOCARBAMOL 500 MG PO TABS: 500.0000 mg | ORAL_TABLET | Freq: Four times a day (QID) | ORAL | Status: DC | PRN

## 2021-12-02 MED ORDER — ONDANSETRON HCL 4 MG/2ML IJ SOLN
INTRAMUSCULAR | Status: AC
  Filled 2021-12-02: qty 2

## 2021-12-02 MED ORDER — ONDANSETRON HCL 4 MG/2ML IJ SOLN: 4.0000 mg | Freq: Four times a day (QID) | INTRAMUSCULAR | Status: DC | PRN

## 2021-12-02 MED ORDER — HYDROCODONE-ACETAMINOPHEN 7.5-325 MG PO TABS: 1.0000 | ORAL_TABLET | ORAL | Status: DC | PRN

## 2021-12-02 MED ORDER — SODIUM CHLORIDE 0.9 % IR SOLN
Status: DC | PRN
  Administered 2021-12-02: 200 mL

## 2021-12-02 MED ORDER — ISOPROPYL ALCOHOL 70 % SOLN
Status: DC | PRN
Start: 2021-12-02 — End: 2021-12-02
  Administered 2021-12-02: 1 via TOPICAL

## 2021-12-02 MED ORDER — BUPIVACAINE-EPINEPHRINE (PF) 0.25% -1:200000 IJ SOLN
INTRAMUSCULAR | Status: AC
  Filled 2021-12-02: qty 30

## 2021-12-02 MED ORDER — CEFAZOLIN SODIUM-DEXTROSE 2-4 GM/100ML-% IV SOLN
2.0000 g | INTRAVENOUS | Status: AC
  Administered 2021-12-02: 2 g via INTRAVENOUS
  Filled 2021-12-02: qty 100

## 2021-12-02 MED ORDER — TRANEXAMIC ACID-NACL 1000-0.7 MG/100ML-% IV SOLN
1000.0000 mg | INTRAVENOUS | Status: AC
  Administered 2021-12-02: 1000 mg via INTRAVENOUS
  Filled 2021-12-02: qty 100

## 2021-12-02 MED ORDER — SENNA 8.6 MG PO TABS: 2.0000 | ORAL_TABLET | Freq: Every day | ORAL | 1 refills | Status: AC

## 2021-12-02 MED ORDER — PROPOFOL 10 MG/ML IV BOLUS
INTRAVENOUS | Status: DC | PRN
  Administered 2021-12-02: 10 mg via INTRAVENOUS

## 2021-12-02 MED ORDER — SODIUM CHLORIDE (PF) 0.9 % IJ SOLN
INTRAMUSCULAR | Status: AC
  Filled 2021-12-02: qty 30

## 2021-12-02 MED ORDER — METHOCARBAMOL 500 MG IVPB - SIMPLE MED
INTRAVENOUS | Status: AC
  Filled 2021-12-02: qty 50

## 2021-12-02 MED ORDER — MIDAZOLAM HCL 2 MG/2ML IJ SOLN
INTRAMUSCULAR | Status: AC
  Filled 2021-12-02: qty 2

## 2021-12-02 MED ORDER — BUPIVACAINE IN DEXTROSE 0.75-8.25 % IT SOLN
INTRATHECAL | Status: DC | PRN
  Administered 2021-12-02: 2 mL via INTRATHECAL

## 2021-12-02 MED ORDER — SODIUM CHLORIDE (PF) 0.9 % IJ SOLN
INTRAMUSCULAR | Status: DC | PRN
  Administered 2021-12-02: 30 mL

## 2021-12-02 MED ORDER — ASPIRIN 81 MG PO CHEW: 81.0000 mg | CHEWABLE_TABLET | Freq: Two times a day (BID) | ORAL | 0 refills | Status: AC

## 2021-12-02 MED ORDER — ACETAMINOPHEN 10 MG/ML IV SOLN
INTRAVENOUS | Status: AC
  Filled 2021-12-02: qty 100

## 2021-12-02 SURGICAL SUPPLY — 61 items
ACE SHELL 54 4H HIP (Shell) ×2 IMPLANT
BAG COUNTER SPONGE SURGICOUNT (BAG) ×1 IMPLANT
BAG DECANTER FOR FLEXI CONT (MISCELLANEOUS) IMPLANT
BAG ZIPLOCK 12X15 (MISCELLANEOUS) ×1 IMPLANT
CHLORAPREP W/TINT 26 (MISCELLANEOUS) ×2 IMPLANT
COVER PERINEAL POST (MISCELLANEOUS) ×2 IMPLANT
COVER SURGICAL LIGHT HANDLE (MISCELLANEOUS) ×2 IMPLANT
DERMABOND ADVANCED (GAUZE/BANDAGES/DRESSINGS) ×1
DERMABOND ADVANCED .7 DNX12 (GAUZE/BANDAGES/DRESSINGS) ×2 IMPLANT
DRAPE IMP U-DRAPE 54X76 (DRAPES) ×2 IMPLANT
DRAPE SHEET LG 3/4 BI-LAMINATE (DRAPES) ×6 IMPLANT
DRAPE STERI IOBAN 125X83 (DRAPES) ×2 IMPLANT
DRAPE U-SHAPE 47X51 STRL (DRAPES) ×4 IMPLANT
DRSG AQUACEL AG ADV 3.5X10 (GAUZE/BANDAGES/DRESSINGS) ×2 IMPLANT
ELECT REM PT RETURN 15FT ADLT (MISCELLANEOUS) ×2 IMPLANT
GAUZE 4X4 16PLY ~~LOC~~+RFID DBL (SPONGE) IMPLANT
GAUZE SPONGE 4X4 12PLY STRL (GAUZE/BANDAGES/DRESSINGS) ×1 IMPLANT
GLOVE SRG 8 PF TXTR STRL LF DI (GLOVE) ×1 IMPLANT
GLOVE SURG ENC MOIS LTX SZ8.5 (GLOVE) ×4 IMPLANT
GLOVE SURG ENC TEXT LTX SZ7.5 (GLOVE) ×4 IMPLANT
GLOVE SURG UNDER POLY LF SZ8 (GLOVE) ×2
GLOVE SURG UNDER POLY LF SZ8.5 (GLOVE) ×2 IMPLANT
GOWN SPEC L3 XXLG W/TWL (GOWN DISPOSABLE) ×2 IMPLANT
GOWN STRL REIN XL XLG (GOWN DISPOSABLE) ×1 IMPLANT
GOWN STRL REUS W/TWL XL LVL3 (GOWN DISPOSABLE) ×3 IMPLANT
HANDPIECE INTERPULSE COAX TIP (DISPOSABLE) ×2
HEAD CERAMIC BIOLOX 36 (Head) ×1 IMPLANT
HOLDER FOLEY CATH W/STRAP (MISCELLANEOUS) ×2 IMPLANT
HOOD PEEL AWAY FLYTE STAYCOOL (MISCELLANEOUS) ×6 IMPLANT
JET LAVAGE IRRISEPT WOUND (IRRIGATION / IRRIGATOR) ×2
KIT TURNOVER KIT A (KITS) IMPLANT
LAVAGE JET IRRISEPT WOUND (IRRIGATION / IRRIGATOR) ×1 IMPLANT
LINER ACETAB NN G7 F 36 (Liner) ×1 IMPLANT
MANIFOLD NEPTUNE II (INSTRUMENTS) ×2 IMPLANT
MARKER SKIN DUAL TIP RULER LAB (MISCELLANEOUS) ×2 IMPLANT
NDL SAFETY ECLIPSE 18X1.5 (NEEDLE) ×1 IMPLANT
NDL SPNL 18GX3.5 QUINCKE PK (NEEDLE) ×1 IMPLANT
NEEDLE HYPO 18GX1.5 SHARP (NEEDLE) ×2
NEEDLE SPNL 18GX3.5 QUINCKE PK (NEEDLE) IMPLANT
PACK ANTERIOR HIP CUSTOM (KITS) ×2 IMPLANT
PENCIL SMOKE EVACUATOR (MISCELLANEOUS) IMPLANT
SAW OSC TIP CART 19.5X105X1.3 (SAW) ×2 IMPLANT
SEALER BIPOLAR AQUA 6.0 (INSTRUMENTS) ×2 IMPLANT
SET HNDPC FAN SPRY TIP SCT (DISPOSABLE) ×1 IMPLANT
SHELL ACETAB 54 4H HIP (Shell) IMPLANT
SPIKE FLUID TRANSFER (MISCELLANEOUS) ×2 IMPLANT
SPONGE T-LAP 18X18 ~~LOC~~+RFID (SPONGE) ×5 IMPLANT
STAPLER INSORB 30 2030 C-SECTI (MISCELLANEOUS) IMPLANT
STEM FEM CEMLS SZ 13 133D (Stem) ×1 IMPLANT
SUT MNCRL AB 3-0 PS2 18 (SUTURE) ×3 IMPLANT
SUT MON AB 2-0 CT1 36 (SUTURE) ×2 IMPLANT
SUT STRATAFIX PDO 1 14 VIOLET (SUTURE) ×2
SUT STRATFX PDO 1 14 VIOLET (SUTURE) ×1
SUT VIC AB 2-0 CT1 27 (SUTURE) ×4
SUT VIC AB 2-0 CT1 TAPERPNT 27 (SUTURE) IMPLANT
SUTURE STRATFX PDO 1 14 VIOLET (SUTURE) ×1 IMPLANT
SYR 3ML LL SCALE MARK (SYRINGE) ×2 IMPLANT
TRAY FOLEY MTR SLVR 14FR STAT (SET/KITS/TRAYS/PACK) ×1 IMPLANT
TRAY FOLEY MTR SLVR 16FR STAT (SET/KITS/TRAYS/PACK) IMPLANT
TUBE SUCTION HIGH CAP CLEAR NV (SUCTIONS) ×2 IMPLANT
WATER STERILE IRR 1000ML POUR (IV SOLUTION) ×3 IMPLANT

## 2021-12-02 NOTE — Anesthesia Preprocedure Evaluation (Signed)
Anesthesia Evaluation  Patient identified by MRN, date of birth, ID band Patient awake    Reviewed: Allergy & Precautions, NPO status , Patient's Chart, lab work & pertinent test results  Airway Mallampati: II  TM Distance: >3 FB Neck ROM: Full    Dental  (+) Dental Advisory Given   Pulmonary former smoker,    breath sounds clear to auscultation       Cardiovascular hypertension, Pt. on medications  Rhythm:Regular Rate:Normal     Neuro/Psych negative neurological ROS     GI/Hepatic Neg liver ROS, GERD  ,  Endo/Other  Hypothyroidism Morbid obesity  Renal/GU negative Renal ROS     Musculoskeletal  (+) Arthritis ,   Abdominal   Peds  Hematology negative hematology ROS (+)   Anesthesia Other Findings   Reproductive/Obstetrics                             Anesthesia Physical Anesthesia Plan  ASA: 2  Anesthesia Plan: Spinal   Post-op Pain Management: Toradol IV (intra-op) and Ofirmev IV (intra-op)   Induction:   PONV Risk Score and Plan: 2 and Propofol infusion, Ondansetron and Treatment may vary due to age or medical condition  Airway Management Planned: Natural Airway and Simple Face Mask  Additional Equipment: None  Intra-op Plan:   Post-operative Plan:   Informed Consent: I have reviewed the patients History and Physical, chart, labs and discussed the procedure including the risks, benefits and alternatives for the proposed anesthesia with the patient or authorized representative who has indicated his/her understanding and acceptance.       Plan Discussed with:   Anesthesia Plan Comments:         Anesthesia Quick Evaluation

## 2021-12-02 NOTE — Anesthesia Postprocedure Evaluation (Signed)
Anesthesia Post Note  Patient: Hannah Allen  Procedure(s) Performed: TOTAL HIP ARTHROPLASTY ANTERIOR APPROACH (Right: Hip)     Patient location during evaluation: PACU Anesthesia Type: Spinal Level of consciousness: awake and alert Pain management: pain level controlled Vital Signs Assessment: post-procedure vital signs reviewed and stable Respiratory status: spontaneous breathing, nonlabored ventilation, respiratory function stable and patient connected to nasal cannula oxygen Cardiovascular status: stable and blood pressure returned to baseline Postop Assessment: no apparent nausea or vomiting and spinal receding Anesthetic complications: no   No notable events documented.  Last Vitals:  Vitals:   12/02/21 1057 12/02/21 1200  BP: 110/74 106/60  Pulse: (!) 57 (!) 56  Resp: 16 14  Temp:    SpO2: 98% 99%    Last Pain:  Vitals:   12/02/21 1200  TempSrc:   PainSc: Asleep                 Tiajuana Amass

## 2021-12-02 NOTE — Transfer of Care (Addendum)
Immediate Anesthesia Transfer of Care Note  Patient: Hannah Allen  Procedure(s) Performed: TOTAL HIP ARTHROPLASTY ANTERIOR APPROACH (Right: Hip)  Patient Location: PACU  Anesthesia Type:Spinal  Level of Consciousness: awake, alert , oriented and patient cooperative  Airway & Oxygen Therapy: Patient Spontanous Breathing and Patient connected to face mask oxygen  Post-op Assessment: Report given to RN and Post -op Vital signs reviewed and stable  Post vital signs: Reviewed and stable  Last Vitals:  Vitals Value Taken Time  BP 96/74 12/02/21 0937  Temp 36.4 C 12/02/21 0937  Pulse 64 12/02/21 0943  Resp 14 12/02/21 0943  SpO2 89 % 12/02/21 0943  Vitals shown include unvalidated device data.  Last Pain:  Vitals:   12/02/21 0617  TempSrc: Oral  PainSc:       Patients Stated Pain Goal: 1 (12/02/21 0542)  Complications: No notable events documented.

## 2021-12-02 NOTE — Evaluation (Signed)
Physical Therapy One Time Evaluation Patient Details Name: Hannah Allen MRN: [id number] DOB: [birth year] Today's Date: 12/02/2021  History of Present Illness  Pt is a 56 year old female s/p Rt THA direct anterior approach on 12/02/21.  Clinical Impression  Patient evaluated by Physical Therapy in PACU with no further acute PT needs identified. All education has been completed and the patient has no further questions.  Pt assisted with ambulating and performed LE exercises.  Pt lives with her son and also has a daughter; she can request assist as needed.  Pt provided a HEP handout and all questions answered within scope of practice.  Pt ready to d/c home today.  See below for any follow-up Physical Therapy or equipment needs. PT is signing off. Thank you for this referral.      Recommendations for follow up therapy are one component of a multi-disciplinary discharge planning process, led by the attending physician.  Recommendations may be updated based on patient status, additional functional criteria and insurance authorization.  Follow Up Recommendations Follow physician's recommendations for discharge plan and follow up therapies (plan for HEP)    Assistance Recommended at Discharge Set up Supervision/Assistance  Patient can return home with the following  A little help with walking and/or transfers;A little help with bathing/dressing/bathroom    Equipment Recommendations None recommended by PT (pt plans to have her children look for toilet raiser and BSC)  Recommendations for Other Services       Functional Status Assessment Patient has had a recent decline in their functional status and demonstrates the ability to make significant improvements in function in a reasonable and predictable amount of time.     Precautions / Restrictions Precautions Precautions: Fall Restrictions Weight Bearing Restrictions: No Other Position/Activity Restrictions: WBAT      Mobility  Bed  Mobility Overal bed mobility: Needs Assistance Bed Mobility: Supine to Sit, Sit to Supine     Supine to sit: Mod assist Sit to supine: Mod assist   General bed mobility comments: required assist for Rt LE and also cues for trunk upright; pt also required assist for Lt LE onto bed while she self assist Rt LE with gait belt; pt reports her bed is lower and she typically has trouble with moving LEs; does have son available to assist her upon d/c    Transfers Overall transfer level: Needs assistance Equipment used: Rolling walker (2 wheels) Transfers: Sit to/from Stand Sit to Stand: Min guard, From elevated surface           General transfer comment: verbal cues for safety and positioning    Ambulation/Gait Ambulation/Gait assistance: Min guard Gait Distance (Feet): 100 Feet Assistive device: Rolling walker (2 wheels) Gait Pattern/deviations: Step-to pattern, Decreased stance time - right, Antalgic, Trunk flexed       General Gait Details: verbal cues for sequence, RW positioning, posture, step length, increased time and effort, pt initially with difficulty advancing Rt LE however improved with cues and distance  Stairs            Wheelchair Mobility    Modified Rankin (Stroke Patients Only)       Balance                                             Pertinent Vitals/Pain Pain Assessment Pain Assessment: 0-10 Pain Score: 6  Pain Location: right hip Pain  Descriptors / Indicators: Tender, Discomfort, Sore Pain Intervention(s): Repositioned, Monitored during session (pt reports improved with mobilizing)    Home Living Family/patient expects to be discharged to:: Private residence Living Arrangements: Children (son)   Type of Home: Apartment Home Access: Level entry       Home Layout: One level Home Equipment: Agricultural consultant (2 wheels) Additional Comments: plan to have children look for toilet raiser or BSC since she has a low toilet     Prior Function Prior Level of Function : Independent/Modified Independent                     Hand Dominance        Extremity/Trunk Assessment        Lower Extremity Assessment Lower Extremity Assessment: RLE deficits/detail;LLE deficits/detail RLE Deficits / Details: anticipated post op hip weakness, hip grossly 2+/5, pt able to perform ankle pumps, fair quad contraction LLE Deficits / Details: observed weakness in left hip, pt reports she needs Lt THA as well, typically lifts this leg into/out of car       Communication   Communication: No difficulties  Cognition Arousal/Alertness: Awake/alert Behavior During Therapy: WFL for tasks assessed/performed Overall Cognitive Status: Within Functional Limits for tasks assessed                                          General Comments      Exercises Total Joint Exercises Ankle Circles/Pumps: AROM, Both, 10 reps Quad Sets: AROM, Both, 10 reps Heel Slides: AAROM, Right, 10 reps Hip ABduction/ADduction: AAROM, Right, 10 reps, Standing, Supine Long Arc Quad: AROM, Seated, Right, 10 reps Knee Flexion: AROM, Right, Standing, 10 reps Marching in Standing: AROM, Right, Standing, 10 reps   Assessment/Plan    PT Assessment Patient does not need any further PT services  PT Problem List         PT Treatment Interventions      PT Goals (Current goals can be found in the Care Plan section)  Acute Rehab PT Goals PT Goal Formulation: All assessment and education complete, DC therapy    Frequency       Co-evaluation               AM-PAC PT "6 Clicks" Mobility  Outcome Measure Help needed turning from your back to your side while in a flat bed without using bedrails?: A Little Help needed moving from lying on your back to sitting on the side of a flat bed without using bedrails?: A Little Help needed moving to and from a bed to a chair (including a wheelchair)?: A Little Help needed standing up  from a chair using your arms (e.g., wheelchair or bedside chair)?: A Little Help needed to walk in hospital room?: A Little Help needed climbing 3-5 steps with a railing? : A Lot 6 Click Score: 17    End of Session Equipment Utilized During Treatment: Gait belt Activity Tolerance: Patient tolerated treatment well Patient left: in bed;with call bell/phone within reach Nurse Communication: Mobility status PT Visit Diagnosis: Difficulty in walking, not elsewhere classified (R26.2)    Time: 2376-2831 PT Time Calculation (min) (ACUTE ONLY): 38 min   Charges:   PT Evaluation $PT Eval Low Complexity: 1 Low PT Treatments $Gait Training: 8-22 mins $Therapeutic Exercise: 8-22 mins      Thomasene Mohair PT, DPT Acute Rehabilitation Services Pager: [phone]  Office: [phone]   Hannah Allen 12/02/2021, 2:48 PM

## 2021-12-02 NOTE — Discharge Instructions (Signed)
? ?Dr. Dylon Correa ?Joint Replacement Specialist ?[facility] ?[address] ?[place], [place] [address] ?[phone] ? ? ?TOTAL HIP REPLACEMENT POSTOPERATIVE DIRECTIONS ? ? ? ?Hip Rehabilitation, Guidelines Following Surgery  ? ?WEIGHT BEARING ?Weight bearing as tolerated with assist device (walker, cane, etc) as directed, use it as long as suggested by your surgeon or therapist, typically at least 4-6 weeks. ? ?The results of a hip operation are greatly improved after range of motion and muscle strengthening exercises. Follow all safety measures which are given to protect your hip. If any of these exercises cause increased pain or swelling in your joint, decrease the amount until you are comfortable again. Then slowly increase the exercises. Call your caregiver if you have problems or questions.  ? ?HOME CARE INSTRUCTIONS  ?Most of the following instructions are designed to prevent the dislocation of your new hip.  ?Remove items at home which could result in a fall. This includes throw rugs or furniture in walking pathways.  ?Continue medications as instructed at time of discharge. ?You may have some home medications which will be placed on hold until you complete the course of blood thinner medication. ?You may start showering once you are discharged home. Do not remove your dressing. ?Do not put on socks or shoes without following the instructions of your caregivers.   ?Sit on chairs with arms. Use the chair arms to help push yourself up when arising.  ?Arrange for the use of a toilet seat elevator so you are not sitting low.  ?Walk with walker as instructed.  ?You may resume a sexual relationship in one month or when given the OK by your caregiver.  ?Use walker as long as suggested by your caregivers.  ?You may put full weight on your legs and walk as much as is comfortable. ?Avoid periods of inactivity such as sitting longer than an hour when not asleep. This helps prevent blood  clots.  ?You may return to work once you are cleared by your surgeon.  ?Do not drive a car for 6 weeks or until released by your surgeon.  ?Do not drive while taking narcotics.  ?Wear elastic stockings for two weeks following surgery during the day but you may remove then at night.  ?Make sure you keep all of your appointments after your operation with all of your doctors and caregivers. You should call the office at the above phone number and make an appointment for approximately two weeks after the date of your surgery. ?Please pick up a stool softener and laxative for home use as long as you are requiring pain medications. ?ICE to the affected hip every three hours for 30 minutes at a time and then as needed for pain and swelling. Continue to use ice on the hip for pain and swelling from surgery. You may notice swelling that will progress down to the foot and ankle.  This is normal after surgery.  Elevate the leg when you are not up walking on it.   ?It is important for you to complete the blood thinner medication as prescribed by your doctor. ?Continue to use the breathing machine which will help keep your temperature down.  It is common for your temperature to cycle up and down following surgery, especially at night when you are not up moving around and exerting yourself.  The breathing machine keeps your lungs expanded and your temperature down. ? ?RANGE OF MOTION AND STRENGTHENING EXERCISES  ?These exercises are designed to help you   keep full movement of your hip joint. Follow your caregiver's or physical therapist's instructions. Perform all exercises about fifteen times, three times per day or as directed. Exercise both hips, even if you have had only one joint replacement. These exercises can be done on a training (exercise) mat, on the floor, on a table or on a bed. Use whatever works the best and is most comfortable for you. Use music or television while you are exercising so that the exercises are a  pleasant break in your day. This will make your life better with the exercises acting as a break in routine you can look forward to.  ?Lying on your back, slowly slide your foot toward your buttocks, raising your knee up off the floor. Then slowly slide your foot back down until your leg is straight again.  ?Lying on your back spread your legs as far apart as you can without causing discomfort.  ?Lying on your side, raise your upper leg and foot straight up from the floor as far as is comfortable. Slowly lower the leg and repeat.  ?Lying on your back, tighten up the muscle in the front of your thigh (quadriceps muscles). You can do this by keeping your leg straight and trying to raise your heel off the floor. This helps strengthen the largest muscle supporting your knee.  ?Lying on your back, tighten up the muscles of your buttocks both with the legs straight and with the knee bent at a comfortable angle while keeping your heel on the floor.  ? ?SKILLED REHAB INSTRUCTIONS: ?If the patient is transferred to a skilled rehab facility following release from the hospital, a list of the current medications will be sent to the facility for the patient to continue.  When discharged from the skilled rehab facility, please have the facility set up the patient's Home Health Physical Therapy prior to being released. Also, the skilled facility will be responsible for providing the patient with their medications at time of release from the facility to include their pain medication and their blood thinner medication. If the patient is still at the rehab facility at time of the two week follow up appointment, the skilled rehab facility will also need to assist the patient in arranging follow up appointment in our office and any transportation needs. ? ?POST-OPERATIVE OPIOID TAPER INSTRUCTIONS: ?It is important to wean off of your opioid medication as soon as possible. If you do not need pain medication after your surgery it is ok  to stop day one. ?Opioids include: ?Codeine, Hydrocodone(Norco, Vicodin), Oxycodone(Percocet, oxycontin) and hydromorphone amongst others.  ?Long term and even short term use of opiods can cause: ?Increased pain response ?Dependence ?Constipation ?Depression ?Respiratory depression ?And more.  ?Withdrawal symptoms can include ?Flu like symptoms ?Nausea, vomiting ?And more ?Techniques to manage these symptoms ?Hydrate well ?Eat regular healthy meals ?Stay active ?Use relaxation techniques(deep breathing, meditating, yoga) ?Do Not substitute Alcohol to help with tapering ?If you have been on opioids for less than two weeks and do not have pain than it is ok to stop all together.  ?Plan to wean off of opioids ?This plan should start within one week post op of your joint replacement. ?Maintain the same interval or time between taking each dose and first decrease the dose.  ?Cut the total daily intake of opioids by one tablet each day ?Next start to increase the time between doses. ?The last dose that should be eliminated is the evening dose.  ? ? ?MAKE   SURE YOU:  ?Understand these instructions.  ?Will watch your condition.  ?Will get help right away if you are not doing well or get worse. ? ?Pick up stool softner and laxative for home use following surgery while on pain medications. ?Do not remove your dressing. ?The dressing is waterproof--it is OK to take showers. ?Continue to use ice for pain and swelling after surgery. ?Do not use any lotions or creams on the incision until instructed by your surgeon. ?Total Hip Protocol. ? ?

## 2021-12-02 NOTE — H&P (Signed)
TOTAL HIP ADMISSION H&P  Patient is admitted for right total hip arthroplasty.  Subjective:  Chief Complaint: right hip pain  HPI: Hannah Allen, 56 y.o. female, has a history of pain and functional disability in the right hip(s) due to arthritis and patient has failed non-surgical conservative treatments for greater than 12 weeks to include NSAID's and/or analgesics, flexibility and strengthening excercises, supervised PT with diminished ADL's post treatment, use of assistive devices, weight reduction as appropriate, and activity modification.  Onset of symptoms was gradual starting 2 years ago with gradually worsening course since that time.The patient noted no past surgery on the right hip(s).  Patient currently rates pain in the right hip at 10 out of 10 with activity. Patient has night pain, worsening of pain with activity and weight bearing, trendelenberg gait, pain that interfers with activities of daily living, and pain with passive range of motion. Patient has evidence of subchondral cysts, subchondral sclerosis, periarticular osteophytes, and joint space narrowing by imaging studies. This condition presents safety issues increasing the risk of falls. There is no current active infection.  Patient Active Problem List   Diagnosis Date Noted   Essential hypertension 09/18/2018   Xerosis of skin 10/01/2016   BMI 40.0-44.9, adult (HCC) 10/01/2016   Varicose veins of lower extremities with other complications 08/10/2012   Sinusitis 06/10/2012   Varicose veins 06/10/2012   Anxiety 06/10/2012   Elevated blood pressure reading 06/10/2012   Ankle pain 05/02/2012   Mixed hyperlipidemia 04/25/2012   Leg swelling 04/24/2012   Morbid obesity (HCC) 04/24/2012   Back pain 04/24/2012   SOB (shortness of breath) 04/24/2012   GERD (gastroesophageal reflux disease) 04/24/2012   Seasonal allergies 04/24/2012   Knee pain, acute 04/24/2012   Past Medical History:  Diagnosis Date   Allergy     Anxiety    Arthritis    GERD (gastroesophageal reflux disease)    Headache    Hyperlipidemia    Hypertension    Hypothyroidism    Pre-diabetes    Varicose veins     Past Surgical History:  Procedure Laterality Date   CHOLECYSTECTOMY     TUBAL LIGATION      Current Facility-Administered Medications  Medication Dose Route Frequency Provider Last Rate Last Admin   0.9 %  sodium chloride infusion   Intravenous Continuous Elody Kleinsasser, Arlys JohnBrian, MD       ceFAZolin (ANCEF) IVPB 2g/100 mL premix  2 g Intravenous On Call to OR Samson FredericSwinteck, Maziah Keeling, MD       lactated ringers infusion   Intravenous Continuous Jairo BenJackson, [place], MD 10 mL/hr at 12/02/21 0604 Continued from Pre-op at 12/02/21 0604   tranexamic acid (CYKLOKAPRON) IVPB 1,000 mg  1,000 mg Intravenous To OR Ashleynicole Mcclees, Arlys JohnBrian, MD       Allergies  Allergen Reactions   Lisinopril     ?angioedema    Social History   Tobacco Use   Smoking status: Former    Types: Cigarettes    Quit date: 08/11/2011    Years since quitting: 10.3   Smokeless tobacco: Never   Tobacco comments:    quit 6 months  Substance Use Topics   Alcohol use: No    Family History  Problem Relation Age of Onset   Heart disease Maternal Grandmother    Diabetes Maternal Grandmother      Review of Systems  Cardiovascular:  Positive for leg swelling.  Musculoskeletal:  Positive for back pain, gait problem and joint swelling.  All other systems reviewed and are negative.  Objective:  Physical Exam Constitutional:      Appearance: Normal appearance.  HENT:     Head: Normocephalic and atraumatic.     Nose: Nose normal.     Mouth/Throat:     Mouth: Mucous membranes are moist.     Pharynx: Oropharynx is clear.  Eyes:     Extraocular Movements: Extraocular movements intact.     Pupils: Pupils are equal, round, and reactive to light.  Cardiovascular:     Rate and Rhythm: Normal rate and regular rhythm.     Pulses: Normal pulses.  Pulmonary:     Effort:  Pulmonary effort is normal. No respiratory distress.  Abdominal:     General: Abdomen is flat.     Palpations: Abdomen is soft.  Genitourinary:    Comments: deferred Musculoskeletal:     Cervical back: Normal range of motion.     Right hip: Bony tenderness present. Decreased range of motion. Decreased strength.  Skin:    General: Skin is warm and dry.     Capillary Refill: Capillary refill takes less than 2 seconds.  Neurological:     General: No focal deficit present.     Mental Status: She is alert and oriented to person, place, and time.  Psychiatric:        Mood and Affect: Mood normal.        Behavior: Behavior normal.        Thought Content: Thought content normal.        Judgment: Judgment normal.    Vital signs in last 24 hours: Temp:  [98.7 F (37.1 C)] 98.7 F (37.1 C) (02/02 0617) Pulse Rate:  [72] 72 (02/02 0617) Resp:  [18] 18 (02/02 0617) BP: (134)/(77) 134/77 (02/02 0617) SpO2:  [99 %] 99 % (02/02 0617) Weight:  [101.7 kg] 101.7 kg (02/02 0543)  Labs:   Estimated body mass index is 39.72 kg/m as calculated from the following:   Height as of this encounter: 5\' 3"  (1.6 m).   Weight as of this encounter: 101.7 kg.   Imaging Review Plain radiographs demonstrate severe degenerative joint disease of the right hip(s). The bone quality appears to be adequate for age and reported activity level.      Assessment/Plan:  End stage arthritis, right hip(s)  The patient history, physical examination, clinical judgement of the provider and imaging studies are consistent with end stage degenerative joint disease of the right hip(s) and total hip arthroplasty is deemed medically necessary. The treatment options including medical management, injection therapy, arthroscopy and arthroplasty were discussed at length. The risks and benefits of total hip arthroplasty were presented and reviewed. The risks due to aseptic loosening, infection, stiffness,  dislocation/subluxation,  thromboembolic complications and other imponderables were discussed.  The patient acknowledged the explanation, agreed to proceed with the plan and consent was signed. Patient is being admitted for inpatient treatment for surgery, pain control, PT, OT, prophylactic antibiotics, VTE prophylaxis, progressive ambulation and ADL's and discharge planning.The patient is planning to be discharged  home w HEP    Patient's anticipated LOS is less than 2 midnights, meeting these requirements: - Younger than 84 - Lives within 1 hour of care - Has a competent adult at home to recover with post-op recover - NO history of  - Chronic pain requiring opiods  - Diabetes  - Coronary Artery Disease  - Heart failure  - Heart attack  - Stroke  - DVT/VTE  - Cardiac arrhythmia  - Respiratory Failure/COPD  - Renal  failure  - Anemia  - Advanced Liver disease

## 2021-12-02 NOTE — Op Note (Signed)
OPERATIVE REPORT  SURGEON: Rod Can, MD   ASSISTANT: Nehemiah Massed, PA-C.  PREOPERATIVE DIAGNOSIS: Right hip arthritis.   POSTOPERATIVE DIAGNOSIS: Right hip arthritis.   PROCEDURE: Right total hip arthroplasty, anterior approach.   IMPLANTS: Biomet Taperloc Complete Microplasty stem, size 13 x 111 mm, standard offset. Biomet G7 OsseoTi Cup, size 54 mm. Biomet Vivacit-E liner, size 36 mm, F, neutral. Biomet Biolox ceramic head ball, size 36 - 3 mm.  ANESTHESIA:  MAC and Spinal  ESTIMATED BLOOD LOSS:-200 mL    ANTIBIOTICS: 2 g Ancef.  DRAINS: None.  COMPLICATIONS: None.   CONDITION: PACU - hemodynamically stable.   BRIEF CLINICAL NOTE: Hannah Allen is a 56 y.o. female with a long-standing history of Right hip arthritis. After failing conservative management, the patient was indicated for total hip arthroplasty. The risks, benefits, and alternatives to the procedure were explained, and the patient elected to proceed.  PROCEDURE IN DETAIL: Surgical site was marked by myself in the pre-op holding area. Once inside the operating room, spinal anesthesia was obtained, and a foley catheter was inserted. The patient was then positioned on the Hana table.  All bony prominences were well padded.  The hip was prepped and draped in the normal sterile surgical fashion.  A time-out was called verifying side and site of surgery. The patient received IV antibiotics within 60 minutes of beginning the procedure.   Bikini incision was made, and superficial dissection was performed lateral to the ASIS. The direct anterior approach to the hip was performed through the Hueter interval.  Lateral femoral circumflex vessels were treated with the Auqumantys. The anterior capsule was exposed and an inverted T capsulotomy was made. The femoral neck cut was made to the level of the templated cut.  A corkscrew was placed into the head and the head was removed.  The femoral head was found to have eburnated  bone. The head was passed to the back table and was measured. Pubofemoral ligament was released off of the calcar, taking care to stay on bone. Superior capsule was released from the greater trochanter, taking care to stay lateral to the posterior border of the femoral neck in order to preserve the short external rotators.   Acetabular exposure was achieved, and the pulvinar and labrum were excised. Sequential reaming of the acetabulum was then performed up to a size 53 mm reamer. A 54 mm cup was then opened and impacted into place at approximately 40 degrees of abduction and 20 degrees of anteversion. The final polyethylene liner was impacted into place and acetabular osteophytes were removed.    I then gained femoral exposure taking care to protect the abductors and greater trochanter.  This was performed using standard external rotation, extension, and adduction.  A cookie cutter was used to enter the femoral canal, and then the femoral canal finder was placed.  Sequential broaching was performed up to a size 13.  Calcar planer was used on the femoral neck remnant.  I placed a standard offset neck and a trial head ball.  The hip was reduced.  Leg lengths and offset were checked fluoroscopically.  The hip was dislocated and trial components were removed.  The final implants were placed, and the hip was reduced.  Fluoroscopy was used to confirm component position and leg lengths.  At 90 degrees of external rotation and full extension, the hip was stable to an anterior directed force.   The wound was copiously irrigated with Prontosan solution and normal saline using pule lavage.  Marcaine solution was injected into the periarticular soft tissue.  The wound was closed in layers using #1 Vicryl and V-Loc for the fascia, 2-0 Vicryl for the subcutaneous fat, 2-0 Monocryl for the deep dermal layer, 3-0 running Monocryl subcuticular stitch, and Dermabond for the skin.  Once the glue was fully dried, an Aquacell Ag  dressing was applied.  The patient was transported to the recovery room in stable condition.  Sponge, needle, and instrument counts were correct at the end of the case x2.  The patient tolerated the procedure well and there were no known complications.  Please note that a surgical assistant was a medical necessity for this procedure to perform it in a safe and expeditious manner. Assistant was necessary to provide appropriate retraction of vital neurovascular structures, to prevent femoral fracture, and to allow for anatomic placement of the prosthesis.

## 2021-12-02 NOTE — Anesthesia Procedure Notes (Signed)
Spinal  Patient location during procedure: OR Start time: 12/02/2021 7:33 AM End time: 12/02/2021 7:38 AM Reason for block: surgical anesthesia Staffing Performed: anesthesiologist  Anesthesiologist: Marcene Duos, MD Preanesthetic Checklist Completed: patient identified, IV checked, site marked, risks and benefits discussed, surgical consent, monitors and equipment checked, pre-op evaluation and timeout performed Spinal Block Patient position: sitting Prep: DuraPrep Patient monitoring: heart rate, cardiac monitor, continuous pulse ox and blood pressure Approach: midline Location: L4-5 Injection technique: single-shot Needle Needle type: Pencan  Needle gauge: 24 G Needle length: 9 cm Assessment Sensory level: T4 Events: CSF return

## 2021-12-03 ENCOUNTER — Encounter (HOSPITAL_COMMUNITY): Payer: Self-pay | Admitting: Orthopedic Surgery

## 2021-12-27 ENCOUNTER — Ambulatory Visit: Payer: Self-pay | Admitting: Orthopedic Surgery

## 2022-01-18 NOTE — Patient Instructions (Addendum)
DUE TO COVID-19 ONLY ONE VISITOR  (aged 57 and older)  IS ALLOWED TO COME WITH YOU AND STAY IN THE WAITING ROOM ONLY DURING PRE OP AND PROCEDURE.   ?**NO VISITORS ARE ALLOWED IN THE SHORT STAY AREA OR RECOVERY ROOM!!** ? ?IF YOU WILL BE ADMITTED INTO THE HOSPITAL YOU ARE ALLOWED ONLY TWO SUPPORT PEOPLE DURING VISITATION HOURS ONLY (7 AM -8PM)   ?The support person(s) must pass our screening, gel in and out, and wear a mask at all times, including in the patient?s room. ?Patients must also wear a mask when staff or their support person are in the room. ?Visitors GUEST BADGE MUST BE WORN VISIBLY  ?One adult visitor may remain with you overnight and MUST be in the room by 8 P.M. ?  ? ? Your procedure is scheduled on: 02/02/22 ? ? Report to [facility] Main Entrance ? ?  Report to admitting at : 6:00 AM ? ? Call this number if you have problems the morning of surgery [phone] ? ? Do not eat food :After Midnight. ? ? After Midnight you may have the following liquids until : 5:30 AM  DAY OF SURGERY ? ?Water ?Black Coffee (sugar ok, NO MILK/CREAM OR CREAMERS)  ?Tea (sugar ok, NO MILK/CREAM OR CREAMERS) regular and decaf                             ?Plain Jell-O (NO RED)                                           ?Fruit ices (not with fruit pulp, NO RED)                                     ?Popsicles (NO RED)                                                                  ?Juice: apple, WHITE grape, WHITE cranberry ?Sports drinks like Gatorade (NO RED) ?Clear broth(vegetable,chicken,beef) ? ?             ?Drink Gatorade drink AT : 5:30 AM the day of surgery.    ?  ?The day of surgery:  ?Drink ONE (1) Pre-Surgery Clear Ensure or G2 at AM the morning of surgery. Drink in one sitting. Do not sip.  ?This drink was given to you during your hospital  ?pre-op appointment visit. ?Nothing else to drink after completing the  ?Pre-Surgery Clear Ensure or G2. ?  ?       If you have questions, please contact your surgeon?s  office. ? ?Oral Hygiene is also important to reduce your risk of infection.                                    ?Remember - BRUSH YOUR TEETH THE MORNING OF SURGERY WITH YOUR REGULAR TOOTHPASTE ? ? Do NOT smoke after Midnight ? ? Take these medicines the morning of  surgery with A SIP OF WATER: amlodipine,pantoprazole. ? ?DO NOT TAKE ANY ORAL DIABETIC MEDICATIONS DAY OF YOUR SURGERY ? ?Bring CPAP mask and tubing day of surgery. ?                  ?           You may not have any metal on your body including hair pins, jewelry, and body piercing ? ?           Do not wear make-up, lotions, powders, perfumes/cologne, or deodorant ? ?Do not wear nail polish including gel and S&S, artificial/acrylic nails, or any other type of covering on natural nails including finger and toenails. If you have artificial nails, gel coating, etc. that needs to be removed by a nail salon please have this removed prior to surgery or surgery may need to be canceled/ delayed if the surgeon/ anesthesia feels like they are unable to be safely monitored.  ? ?Do not shave  48 hours prior to surgery. ? ? Do not bring valuables to the hospital. [facility] IS NOT ?            RESPONSIBLE   FOR VALUABLES. ? ? Contacts, dentures or bridgework may not be worn into surgery. ? ? Bring small overnight bag day of surgery. ?  ? Patients discharged on the day of surgery will not be allowed to drive home.  Someone NEEDS to stay with you for the first 24 hours after anesthesia. ? ? Special Instructions: Bring a copy of your healthcare power of attorney and living will documents         the day of surgery if you haven't scanned them before. ? ?            Please read over the following fact sheets you were given: IF YOU HAVE QUESTIONS ABOUT YOUR PRE-OP INSTRUCTIONS PLEASE CALL [phone] ? ?   [facility] - Preparing for Surgery ?Before surgery, you can play an important role.  Because skin is not sterile, your skin needs to be as free of germs as possible.   You can reduce the number of germs on your skin by washing with CHG (chlorahexidine gluconate) soap before surgery.  CHG is an antiseptic cleaner which kills germs and bonds with the skin to continue killing germs even after washing. ?Please DO NOT use if you have an allergy to CHG or antibacterial soaps.  If your skin becomes reddened/irritated stop using the CHG and inform your nurse when you arrive at Short Stay. ?Do not shave (including legs and underarms) for at least 48 hours prior to the first CHG shower.  You may shave your face/neck. ?Please follow these instructions carefully: ? 1.  Shower with CHG Soap the night before surgery and the  morning of Surgery. ? 2.  If you choose to wash your hair, wash your hair first as usual with your  normal  shampoo. ? 3.  After you shampoo, rinse your hair and body thoroughly to remove the  shampoo.                           4.  Use CHG as you would any other liquid soap.  You can apply chg directly  to the skin and wash  ?                     Gently with a scrungie or clean washcloth. ?  5.  Apply the CHG Soap to your body ONLY FROM THE NECK DOWN.   Do not use on face/ open      ?                     Wound or open sores. Avoid contact with eyes, ears mouth and genitals (private parts).  ?                     Engineering geologist,  Genitals (private parts) with your normal soap. ?            6.  Wash thoroughly, paying special attention to the area where your surgery  will be performed. ? 7.  Thoroughly rinse your body with warm water from the neck down. ? 8.  DO NOT shower/wash with your normal soap after using and rinsing off  the CHG Soap. ?               9.  Pat yourself dry with a clean towel. ?           10.  Wear clean pajamas. ?           11.  Place clean sheets on your bed the night of your first shower and do not  sleep with pets. ?Day of Surgery : ?Do not apply any lotions/deodorants the morning of surgery.  Please wear clean clothes to the hospital/surgery  center. ? ?FAILURE TO FOLLOW THESE INSTRUCTIONS MAY RESULT IN THE CANCELLATION OF YOUR SURGERY ?PATIENT SIGNATURE_________________________________ ? ?NURSE SIGNATURE__________________________________ ? ?________________________________________________________________________  ? ?Incentive Spirometer ? ?An incentive spirometer is a tool that can help keep your lungs clear and active. This tool measures how well you are filling your lungs with each breath. Taking long deep breaths may help reverse or decrease the chance of developing breathing (pulmonary) problems (especially infection) following: ?A long period of time when you are unable to move or be active. ?BEFORE THE PROCEDURE  ?If the spirometer includes an indicator to show your best effort, your nurse or respiratory therapist will set it to a desired goal. ?If possible, sit up straight or lean slightly forward. Try not to slouch. ?Hold the incentive spirometer in an upright position. ?INSTRUCTIONS FOR USE  ?Sit on the edge of your bed if possible, or sit up as far as you can in bed or on a chair. ?Hold the incentive spirometer in an upright position. ?Breathe out normally. ?Place the mouthpiece in your mouth and seal your lips tightly around it. ?Breathe in slowly and as deeply as possible, raising the piston or the ball toward the top of the column. ?Hold your breath for 3-5 seconds or for as long as possible. Allow the piston or ball to fall to the bottom of the column. ?Remove the mouthpiece from your mouth and breathe out normally. ?Rest for a few seconds and repeat Steps 1 through 7 at least 10 times every 1-2 hours when you are awake. Take your time and take a few normal breaths between deep breaths. ?The spirometer may include an indicator to show your best effort. Use the indicator as a goal to work toward during each repetition. ?After each set of 10 deep breaths, practice coughing to be sure your lungs are clear. If you have an incision (the cut  made at the time of surgery), support your incision when coughing by placing a pillow or rolled up towels firmly against it. ?Once you are able to  get out of bed, walk around indoors and cough well. You may stop using th

## 2022-01-19 ENCOUNTER — Encounter (HOSPITAL_COMMUNITY)
Admission: RE | Admit: 2022-01-19 | Discharge: 2022-01-19 | Disposition: A | Discharge status: Home / Self Care | Payer: No Typology Code available for payment source | Source: Ambulatory Visit | Attending: Orthopedic Surgery | Admitting: Orthopedic Surgery

## 2022-01-19 ENCOUNTER — Encounter (HOSPITAL_COMMUNITY): Payer: Self-pay

## 2022-01-19 ENCOUNTER — Other Ambulatory Visit: Payer: Self-pay

## 2022-01-19 VITALS — BP 133/86 | HR 68 | Temp 98.6°F | Resp 18 | Ht 63.5 in | Wt 228.0 lb

## 2022-01-19 DIAGNOSIS — Z01818 Encounter for other preprocedural examination: Secondary | ICD-10-CM | POA: Diagnosis present

## 2022-01-19 DIAGNOSIS — I10 Essential (primary) hypertension: Secondary | ICD-10-CM | POA: Diagnosis not present

## 2022-01-19 DIAGNOSIS — R7303 Prediabetes: Secondary | ICD-10-CM | POA: Diagnosis not present

## 2022-01-19 LAB — BASIC METABOLIC PANEL
Anion gap: 9 (ref 5–15)
BUN: 20 mg/dL (ref 6–20)
CO2: 27 mmol/L (ref 22–32)
Calcium: 9.6 mg/dL (ref 8.9–10.3)
Chloride: 99 mmol/L (ref 98–111)
Creatinine, Ser: 0.75 mg/dL (ref 0.44–1.00)
GFR, Estimated: >60 mL/min (ref >60)
Glucose, Bld: 97 mg/dL (ref 70–99)
Potassium: 3.6 mmol/L (ref 3.5–5.1)
Sodium: 135 mmol/L (ref 135–145)

## 2022-01-19 LAB — HEMOGLOBIN A1C
Hgb A1c MFr Bld: 5.2 % (ref 4.8–5.6)
Mean Plasma Glucose: 102.54 mg/dL

## 2022-01-19 LAB — CBC
HCT: 39.9 % (ref 36.0–46.0)
Hemoglobin: 13.1 g/dL (ref 12.0–15.0)
MCH: 27.9 pg (ref 26.0–34.0)
MCHC: 32.8 g/dL (ref 30.0–36.0)
MCV: 85.1 fL (ref 80.0–100.0)
Platelets: 329 10*3/uL (ref 150–400)
RBC: 4.69 MIL/uL (ref 3.87–5.11)
RDW: 13.7 % (ref 11.5–15.5)
WBC: 5.4 10*3/uL (ref 4.0–10.5)
nRBC: 0 % (ref 0.0–0.2)

## 2022-01-19 LAB — SURGICAL PCR SCREEN
MRSA, PCR: NEGATIVE
Staphylococcus aureus: POSITIVE — AB

## 2022-01-19 LAB — GLUCOSE, CAPILLARY: Glucose-Capillary: 99 mg/dL (ref 70–99)

## 2022-01-19 NOTE — Progress Notes (Signed)
For Short Stay: ?[place] appointment date: N/A ?Date of COVID positive in last 90 days: N/A ?COVID Vaccine: NO ?Bowel Prep reminder: N/A ? ? ?For Anesthesia: ?PCP - Eloy End: FNP ?Cardiologist -  ? ?Chest x-ray -  ?EKG -  ?Stress Test -  ?ECHO -  ?Cardiac Cath -  ?Pacemaker/ICD device last checked: ?Pacemaker orders received: ?Device Rep notified: ? ?Spinal Cord Stimulator: ? ?Sleep Study -  ?CPAP -  ? ?Fasting Blood Sugar - N/A ?Checks Blood Sugar ___0__ times a day ?Date and result of last Hgb A1c- ? ?Blood Thinner Instructions: ?Aspirin Instructions: ?Last Dose: ? ?Activity level: Can go up a flight of stairs and activities of daily living without stopping and without chest pain and/or shortness of breath ?  Able to exercise without chest pain and/or shortness of breath ?  Unable to go up a flight of stairs without chest pain and/or shortness of breath ?   ? ?Anesthesia review: Hx: HTN,Pre-DIA ? ?Patient denies shortness of breath, fever, cough and chest pain at PAT appointment ? ? ?Patient verbalized understanding of instructions that were given to them at the PAT appointment. Patient was also instructed that they will need to review over the PAT instructions again at home before surgery.  ?

## 2022-01-19 NOTE — Progress Notes (Signed)
PCR: + STAPH °

## 2022-02-01 ENCOUNTER — Encounter (HOSPITAL_COMMUNITY): Payer: Self-pay | Admitting: Orthopedic Surgery

## 2022-02-02 ENCOUNTER — Encounter (HOSPITAL_COMMUNITY): Payer: Self-pay | Admitting: Orthopedic Surgery

## 2022-02-02 ENCOUNTER — Ambulatory Visit (HOSPITAL_COMMUNITY): Payer: No Typology Code available for payment source

## 2022-02-02 ENCOUNTER — Encounter (HOSPITAL_COMMUNITY)
Admission: RE | Disposition: A | Discharge status: Home / Self Care | Payer: Self-pay | Source: Home / Self Care | Attending: Orthopedic Surgery

## 2022-02-02 ENCOUNTER — Ambulatory Visit (HOSPITAL_BASED_OUTPATIENT_CLINIC_OR_DEPARTMENT_OTHER): Payer: No Typology Code available for payment source | Admitting: Anesthesiology

## 2022-02-02 ENCOUNTER — Other Ambulatory Visit: Payer: Self-pay

## 2022-02-02 ENCOUNTER — Ambulatory Visit (HOSPITAL_COMMUNITY)
Admission: RE | Admit: 2022-02-02 | Discharge: 2022-02-02 | Disposition: A | Discharge status: Home / Self Care | Payer: No Typology Code available for payment source | Attending: Orthopedic Surgery | Admitting: Orthopedic Surgery

## 2022-02-02 ENCOUNTER — Ambulatory Visit (HOSPITAL_COMMUNITY): Payer: No Typology Code available for payment source | Admitting: Anesthesiology

## 2022-02-02 DIAGNOSIS — M1612 Unilateral primary osteoarthritis, left hip: Secondary | ICD-10-CM | POA: Diagnosis present

## 2022-02-02 DIAGNOSIS — Z79899 Other long term (current) drug therapy: Secondary | ICD-10-CM | POA: Diagnosis not present

## 2022-02-02 DIAGNOSIS — Z6839 Body mass index (BMI) 39.0-39.9, adult: Secondary | ICD-10-CM | POA: Insufficient documentation

## 2022-02-02 DIAGNOSIS — Z87891 Personal history of nicotine dependence: Secondary | ICD-10-CM | POA: Diagnosis not present

## 2022-02-02 DIAGNOSIS — I10 Essential (primary) hypertension: Secondary | ICD-10-CM | POA: Insufficient documentation

## 2022-02-02 HISTORY — PX: TOTAL HIP ARTHROPLASTY: SHX124

## 2022-02-02 LAB — TYPE AND SCREEN
ABO/RH(D): A POS
Antibody Screen: NEGATIVE

## 2022-02-02 SURGERY — ARTHROPLASTY, HIP, TOTAL, ANTERIOR APPROACH
Anesthesia: Spinal | Site: Hip | Laterality: Left

## 2022-02-02 MED ORDER — CEFAZOLIN SODIUM-DEXTROSE 2-4 GM/100ML-% IV SOLN: 2.0000 g | Freq: Four times a day (QID) | INTRAVENOUS | Status: DC

## 2022-02-02 MED ORDER — HYDROCODONE-ACETAMINOPHEN 5-325 MG PO TABS: 1.0000 | ORAL_TABLET | ORAL | Status: DC | PRN

## 2022-02-02 MED ORDER — HYDROMORPHONE HCL 1 MG/ML IJ SOLN
INTRAMUSCULAR | Status: AC
  Filled 2022-02-02: qty 1

## 2022-02-02 MED ORDER — ISOPROPYL ALCOHOL 70 % SOLN
Status: DC | PRN
  Administered 2022-02-02: 1 via TOPICAL

## 2022-02-02 MED ORDER — DOCUSATE SODIUM 100 MG PO CAPS: 100.0000 mg | ORAL_CAPSULE | Freq: Two times a day (BID) | ORAL | 1 refills | Status: AC

## 2022-02-02 MED ORDER — PROPOFOL 500 MG/50ML IV EMUL
INTRAVENOUS | Status: AC
  Filled 2022-02-02: qty 50

## 2022-02-02 MED ORDER — CHLORHEXIDINE GLUCONATE 0.12 % MT SOLN
15.0000 mL | Freq: Once | OROMUCOSAL | Status: AC
  Administered 2022-02-02: 15 mL via OROMUCOSAL

## 2022-02-02 MED ORDER — FENTANYL CITRATE PF 50 MCG/ML IJ SOSY
PREFILLED_SYRINGE | INTRAMUSCULAR | Status: AC
  Filled 2022-02-02: qty 2

## 2022-02-02 MED ORDER — POVIDONE-IODINE 10 % EX SWAB: 2.0000 "application " | Freq: Once | CUTANEOUS | Status: DC

## 2022-02-02 MED ORDER — HYDROCODONE-ACETAMINOPHEN 7.5-325 MG PO TABS
1.0000 | ORAL_TABLET | ORAL | Status: DC | PRN
  Administered 2022-02-02: 1 via ORAL

## 2022-02-02 MED ORDER — KETOROLAC TROMETHAMINE 30 MG/ML IJ SOLN
INTRAMUSCULAR | Status: AC
  Filled 2022-02-02: qty 1

## 2022-02-02 MED ORDER — HYDROCODONE-ACETAMINOPHEN 7.5-325 MG PO TABS
ORAL_TABLET | ORAL | Status: AC
  Filled 2022-02-02: qty 1

## 2022-02-02 MED ORDER — BUPIVACAINE IN DEXTROSE 0.75-8.25 % IT SOLN
INTRATHECAL | Status: DC | PRN
  Administered 2022-02-02: 1.8 mL via INTRATHECAL

## 2022-02-02 MED ORDER — ACETAMINOPHEN 160 MG/5ML PO SOLN: 325.0000 mg | ORAL | Status: DC | PRN

## 2022-02-02 MED ORDER — LIDOCAINE 2% (20 MG/ML) 5 ML SYRINGE
INTRAMUSCULAR | Status: DC | PRN
  Administered 2022-02-02: 60 mg via INTRAVENOUS

## 2022-02-02 MED ORDER — PHENYLEPHRINE HCL-NACL 20-0.9 MG/250ML-% IV SOLN
INTRAVENOUS | Status: DC | PRN
  Administered 2022-02-02: 45 ug/min via INTRAVENOUS

## 2022-02-02 MED ORDER — CELECOXIB 200 MG PO CAPS
200.0000 mg | ORAL_CAPSULE | Freq: Once | ORAL | Status: AC
  Administered 2022-02-02: 200 mg via ORAL
  Filled 2022-02-02: qty 1

## 2022-02-02 MED ORDER — MEPERIDINE HCL 50 MG/ML IJ SOLN: 6.2500 mg | INTRAMUSCULAR | Status: DC | PRN

## 2022-02-02 MED ORDER — MELOXICAM 15 MG PO TABS: 15.0000 mg | ORAL_TABLET | Freq: Every day | ORAL | 3 refills | Status: AC

## 2022-02-02 MED ORDER — FENTANYL CITRATE (PF) 100 MCG/2ML IJ SOLN
INTRAMUSCULAR | Status: AC
  Filled 2022-02-02: qty 2

## 2022-02-02 MED ORDER — PHENYLEPHRINE HCL-NACL 20-0.9 MG/250ML-% IV SOLN
INTRAVENOUS | Status: AC
  Filled 2022-02-02: qty 500

## 2022-02-02 MED ORDER — OXYCODONE HCL 5 MG PO TABS
5.0000 mg | ORAL_TABLET | Freq: Once | ORAL | Status: AC | PRN
  Administered 2022-02-02: 5 mg via ORAL

## 2022-02-02 MED ORDER — TRANEXAMIC ACID-NACL 1000-0.7 MG/100ML-% IV SOLN
1000.0000 mg | INTRAVENOUS | Status: AC
  Administered 2022-02-02: 1000 mg via INTRAVENOUS
  Filled 2022-02-02: qty 100

## 2022-02-02 MED ORDER — SENNA 8.6 MG PO TABS: 2.0000 | ORAL_TABLET | Freq: Every day | ORAL | 1 refills | Status: AC

## 2022-02-02 MED ORDER — POLYETHYLENE GLYCOL 3350 17 G PO PACK: 17.0000 g | PACK | Freq: Every day | ORAL | 0 refills | Status: DC | PRN

## 2022-02-02 MED ORDER — ONDANSETRON HCL 4 MG/2ML IJ SOLN: 4.0000 mg | Freq: Once | INTRAMUSCULAR | Status: DC | PRN

## 2022-02-02 MED ORDER — LACTATED RINGERS IV BOLUS
500.0000 mL | Freq: Once | INTRAVENOUS | Status: AC
  Administered 2022-02-02: 500 mL via INTRAVENOUS

## 2022-02-02 MED ORDER — SODIUM CHLORIDE 0.9 % IV SOLN
INTRAVENOUS | Status: DC
Start: 2022-02-02 — End: 2022-02-03

## 2022-02-02 MED ORDER — SODIUM CHLORIDE (PF) 0.9 % IJ SOLN
INTRAMUSCULAR | Status: AC
  Filled 2022-02-02: qty 30

## 2022-02-02 MED ORDER — LACTATED RINGERS IV SOLN: INTRAVENOUS | Status: DC

## 2022-02-02 MED ORDER — KETOROLAC TROMETHAMINE 30 MG/ML IJ SOLN
INTRAMUSCULAR | Status: DC | PRN
  Administered 2022-02-02: 30 mg

## 2022-02-02 MED ORDER — ACETAMINOPHEN 500 MG PO TABS: 1000.0000 mg | ORAL_TABLET | Freq: Once | ORAL | Status: DC

## 2022-02-02 MED ORDER — OXYCODONE HCL 5 MG PO TABS
ORAL_TABLET | ORAL | Status: AC
  Filled 2022-02-02: qty 1

## 2022-02-02 MED ORDER — WATER FOR IRRIGATION, STERILE IR SOLN
Status: DC | PRN
  Administered 2022-02-02: 2000 mL

## 2022-02-02 MED ORDER — METHOCARBAMOL 500 MG PO TABS: 500.0000 mg | ORAL_TABLET | Freq: Four times a day (QID) | ORAL | Status: DC | PRN

## 2022-02-02 MED ORDER — METHOCARBAMOL 500 MG IVPB - SIMPLE MED
INTRAVENOUS | Status: AC
  Filled 2022-02-02: qty 50

## 2022-02-02 MED ORDER — SODIUM CHLORIDE 0.9 % IR SOLN
Status: DC | PRN
  Administered 2022-02-02: 2000 mL

## 2022-02-02 MED ORDER — PROPOFOL 500 MG/50ML IV EMUL
INTRAVENOUS | Status: DC | PRN
  Administered 2022-02-02: 100 ug/kg/min via INTRAVENOUS

## 2022-02-02 MED ORDER — METHOCARBAMOL 500 MG IVPB - SIMPLE MED
500.0000 mg | Freq: Four times a day (QID) | INTRAVENOUS | Status: DC | PRN
  Administered 2022-02-02: 500 mg via INTRAVENOUS

## 2022-02-02 MED ORDER — PROPOFOL 1000 MG/100ML IV EMUL
INTRAVENOUS | Status: AC
  Filled 2022-02-02: qty 100

## 2022-02-02 MED ORDER — HYDROCODONE-ACETAMINOPHEN 5-325 MG PO TABS: 1.0000 | ORAL_TABLET | ORAL | 0 refills | Status: DC | PRN

## 2022-02-02 MED ORDER — FENTANYL CITRATE PF 50 MCG/ML IJ SOSY
PREFILLED_SYRINGE | INTRAMUSCULAR | Status: AC
  Filled 2022-02-02: qty 1

## 2022-02-02 MED ORDER — MIDAZOLAM HCL 2 MG/2ML IJ SOLN
INTRAMUSCULAR | Status: AC
  Filled 2022-02-02: qty 2

## 2022-02-02 MED ORDER — LACTATED RINGERS IV BOLUS
250.0000 mL | Freq: Once | INTRAVENOUS | Status: AC
  Administered 2022-02-02: 250 mL via INTRAVENOUS

## 2022-02-02 MED ORDER — FENTANYL CITRATE PF 50 MCG/ML IJ SOSY
25.0000 ug | PREFILLED_SYRINGE | INTRAMUSCULAR | Status: DC | PRN
  Administered 2022-02-02: 25 ug via INTRAVENOUS
  Administered 2022-02-02 (×2): 50 ug via INTRAVENOUS
  Administered 2022-02-02: 25 ug via INTRAVENOUS

## 2022-02-02 MED ORDER — HYDROMORPHONE HCL 1 MG/ML IJ SOLN
INTRAMUSCULAR | Status: DC | PRN
  Administered 2022-02-02: 1 mg via INTRAVENOUS

## 2022-02-02 MED ORDER — BUPIVACAINE-EPINEPHRINE 0.25% -1:200000 IJ SOLN
INTRAMUSCULAR | Status: DC | PRN
  Administered 2022-02-02: 30 mL

## 2022-02-02 MED ORDER — SODIUM CHLORIDE (PF) 0.9 % IJ SOLN
INTRAMUSCULAR | Status: DC | PRN
  Administered 2022-02-02: 30 mL

## 2022-02-02 MED ORDER — ORAL CARE MOUTH RINSE: 15.0000 mL | Freq: Once | OROMUCOSAL | Status: AC

## 2022-02-02 MED ORDER — DEXAMETHASONE SODIUM PHOSPHATE 10 MG/ML IJ SOLN
INTRAMUSCULAR | Status: DC | PRN
  Administered 2022-02-02: 4 mg via INTRAVENOUS

## 2022-02-02 MED ORDER — ONDANSETRON HCL 4 MG PO TABS: 4.0000 mg | ORAL_TABLET | Freq: Three times a day (TID) | ORAL | 0 refills | Status: DC | PRN

## 2022-02-02 MED ORDER — CEFAZOLIN SODIUM-DEXTROSE 2-4 GM/100ML-% IV SOLN
2.0000 g | INTRAVENOUS | Status: AC
  Administered 2022-02-02: 2 g via INTRAVENOUS
  Filled 2022-02-02: qty 100

## 2022-02-02 MED ORDER — MIDAZOLAM HCL 5 MG/5ML IJ SOLN
INTRAMUSCULAR | Status: DC | PRN
  Administered 2022-02-02: 2 mg via INTRAVENOUS

## 2022-02-02 MED ORDER — ASPIRIN 81 MG PO CHEW: 81.0000 mg | CHEWABLE_TABLET | Freq: Two times a day (BID) | ORAL | 0 refills | Status: AC

## 2022-02-02 MED ORDER — PROPOFOL 10 MG/ML IV BOLUS
INTRAVENOUS | Status: DC | PRN
  Administered 2022-02-02: 10 mg via INTRAVENOUS
  Administered 2022-02-02: 30 mg via INTRAVENOUS
  Administered 2022-02-02: 20 mg via INTRAVENOUS

## 2022-02-02 MED ORDER — CEFAZOLIN SODIUM-DEXTROSE 2-4 GM/100ML-% IV SOLN
2.0000 g | Freq: Four times a day (QID) | INTRAVENOUS | Status: DC
  Administered 2022-02-02: 2 g via INTRAVENOUS

## 2022-02-02 MED ORDER — CEFAZOLIN SODIUM-DEXTROSE 2-4 GM/100ML-% IV SOLN
INTRAVENOUS | Status: AC
  Filled 2022-02-02: qty 100

## 2022-02-02 MED ORDER — ONDANSETRON HCL 4 MG/2ML IJ SOLN
INTRAMUSCULAR | Status: DC | PRN
  Administered 2022-02-02: 4 mg via INTRAVENOUS

## 2022-02-02 MED ORDER — FENTANYL CITRATE (PF) 100 MCG/2ML IJ SOLN
INTRAMUSCULAR | Status: DC | PRN
  Administered 2022-02-02: 50 ug via INTRAVENOUS

## 2022-02-02 MED ORDER — ACETAMINOPHEN 325 MG PO TABS: 325.0000 mg | ORAL_TABLET | ORAL | Status: DC | PRN

## 2022-02-02 MED ORDER — ACETAMINOPHEN 500 MG PO TABS
1000.0000 mg | ORAL_TABLET | Freq: Once | ORAL | Status: AC
  Administered 2022-02-02: 1000 mg via ORAL
  Filled 2022-02-02: qty 2

## 2022-02-02 MED ORDER — OXYCODONE HCL 5 MG/5ML PO SOLN: 5.0000 mg | Freq: Once | ORAL | Status: AC | PRN

## 2022-02-02 MED ORDER — BUPIVACAINE-EPINEPHRINE (PF) 0.25% -1:200000 IJ SOLN
INTRAMUSCULAR | Status: AC
  Filled 2022-02-02: qty 30

## 2022-02-02 MED ORDER — METHOCARBAMOL 500 MG PO TABS
500.0000 mg | ORAL_TABLET | Freq: Four times a day (QID) | ORAL | 0 refills | Status: AC | PRN
Start: 2022-02-02 — End: ?

## 2022-02-02 MED ORDER — PROPOFOL 10 MG/ML IV BOLUS
INTRAVENOUS | Status: AC
  Filled 2022-02-02: qty 20

## 2022-02-02 MED ORDER — DEXMEDETOMIDINE (PRECEDEX) IN NS 20 MCG/5ML (4 MCG/ML) IV SYRINGE
PREFILLED_SYRINGE | INTRAVENOUS | Status: AC
  Filled 2022-02-02: qty 5

## 2022-02-02 SURGICAL SUPPLY — 54 items
ACE SHELL 3H 52 E HIP (Shell) ×2 IMPLANT
BAG COUNTER SPONGE SURGICOUNT (BAG) IMPLANT
BAG DECANTER FOR FLEXI CONT (MISCELLANEOUS) IMPLANT
BAG ZIPLOCK 12X15 (MISCELLANEOUS) IMPLANT
CHLORAPREP W/TINT 26 (MISCELLANEOUS) ×2 IMPLANT
COVER PERINEAL POST (MISCELLANEOUS) ×2 IMPLANT
COVER SURGICAL LIGHT HANDLE (MISCELLANEOUS) ×2 IMPLANT
DERMABOND ADVANCED (GAUZE/BANDAGES/DRESSINGS) ×2
DERMABOND ADVANCED .7 DNX12 (GAUZE/BANDAGES/DRESSINGS) ×2 IMPLANT
DRAPE IMP U-DRAPE 54X76 (DRAPES) ×2 IMPLANT
DRAPE SHEET LG 3/4 BI-LAMINATE (DRAPES) ×6 IMPLANT
DRAPE STERI IOBAN 125X83 (DRAPES) ×2 IMPLANT
DRAPE U-SHAPE 47X51 STRL (DRAPES) ×4 IMPLANT
DRSG AQUACEL AG ADV 3.5X 6 (GAUZE/BANDAGES/DRESSINGS) ×1 IMPLANT
DRSG AQUACEL AG ADV 3.5X10 (GAUZE/BANDAGES/DRESSINGS) ×2 IMPLANT
ELECT REM PT RETURN 15FT ADLT (MISCELLANEOUS) ×2 IMPLANT
GAUZE SPONGE 4X4 12PLY STRL (GAUZE/BANDAGES/DRESSINGS) ×2 IMPLANT
GLOVE SURG ENC MOIS LTX SZ8.5 (GLOVE) ×4 IMPLANT
GLOVE SURG UNDER POLY LF SZ8.5 (GLOVE) ×2 IMPLANT
GOWN SPEC L3 XXLG W/TWL (GOWN DISPOSABLE) ×4 IMPLANT
HANDPIECE INTERPULSE COAX TIP (DISPOSABLE) ×2
HEAD CERAMIC BIOLOX 36 (Head) ×1 IMPLANT
HOLDER FOLEY CATH W/STRAP (MISCELLANEOUS) ×2 IMPLANT
HOOD PEEL AWAY FLYTE STAYCOOL (MISCELLANEOUS) ×6 IMPLANT
KIT TURNOVER KIT A (KITS) IMPLANT
LINER ACE G7 HIGH 36 SZ E (Liner) ×1 IMPLANT
MANIFOLD NEPTUNE II (INSTRUMENTS) ×2 IMPLANT
MARKER SKIN DUAL TIP RULER LAB (MISCELLANEOUS) ×2 IMPLANT
NDL SAFETY ECLIPSE 18X1.5 (NEEDLE) ×1 IMPLANT
NDL SPNL 18GX3.5 QUINCKE PK (NEEDLE) ×1 IMPLANT
NEEDLE HYPO 18GX1.5 SHARP (NEEDLE) ×2
NEEDLE SPNL 18GX3.5 QUINCKE PK (NEEDLE) ×2 IMPLANT
PACK ANTERIOR HIP CUSTOM (KITS) ×2 IMPLANT
PENCIL SMOKE EVACUATOR (MISCELLANEOUS) IMPLANT
SAW OSC TIP CART 19.5X105X1.3 (SAW) ×2 IMPLANT
SEALER BIPOLAR AQUA 6.0 (INSTRUMENTS) ×2 IMPLANT
SET HNDPC FAN SPRY TIP SCT (DISPOSABLE) ×1 IMPLANT
SHELL ACETAB 3H 52 E HIP (Shell) IMPLANT
SOLUTION PRONTOSAN WOUND 350ML (IRRIGATION / IRRIGATOR) ×2 IMPLANT
SPIKE FLUID TRANSFER (MISCELLANEOUS) ×2 IMPLANT
SPONGE T-LAP 18X18 ~~LOC~~+RFID (SPONGE) ×6 IMPLANT
STAPLER INSORB 30 2030 C-SECTI (MISCELLANEOUS) IMPLANT
STEM FEM CEMLS SZ 13 133D (Stem) ×1 IMPLANT
SUT MNCRL AB 3-0 PS2 18 (SUTURE) ×2 IMPLANT
SUT MON AB 2-0 CT1 36 (SUTURE) ×2 IMPLANT
SUT STRATAFIX PDO 1 14 VIOLET (SUTURE) ×2
SUT STRATFX PDO 1 14 VIOLET (SUTURE) ×1
SUT VIC AB 2-0 CT1 27 (SUTURE)
SUT VIC AB 2-0 CT1 TAPERPNT 27 (SUTURE) IMPLANT
SUTURE STRATFX PDO 1 14 VIOLET (SUTURE) ×1 IMPLANT
SYR 3ML LL SCALE MARK (SYRINGE) ×2 IMPLANT
TRAY FOLEY MTR SLVR 16FR STAT (SET/KITS/TRAYS/PACK) IMPLANT
TUBE SUCTION HIGH CAP CLEAR NV (SUCTIONS) ×2 IMPLANT
WATER STERILE IRR 1000ML POUR (IV SOLUTION) ×2 IMPLANT

## 2022-02-02 NOTE — Op Note (Signed)
OPERATIVE REPORT ? ?SURGEON: Rod Can, MD  ? ?ASSISTANT: Nehemiah Massed, PA-C ?Larene Pickett, PA-C ? ?PREOPERATIVE DIAGNOSIS: Left hip arthritis.  ? ?POSTOPERATIVE DIAGNOSIS: Left hip arthritis.  ? ?PROCEDURE: Left total hip arthroplasty, anterior approach.  ? ?IMPLANTS: Biomet Taperloc Complete Microplasty stem, size 13 x 111 mm, standard offset. ?Biomet G7 OsseoTi Cup, size 52 mm. ?Biomet Vivacit-E liner, size 36 mm, E, neutral. ?Biomet Biolox ceramic head ball, size 36 - 3 mm. ? ?ANESTHESIA:  MAC, Regional, and Spinal ? ?ESTIMATED BLOOD LOSS:-300 mL   ? ?ANTIBIOTICS: 3 g Ancef. ? ?DRAINS: None. ? ?COMPLICATIONS: None. ?  ?CONDITION: PACU - hemodynamically stable.  ? ?BRIEF CLINICAL NOTE: Hannah Allen is a 56 y.o. female with a long-standing history of Left hip arthritis. After failing conservative management, the patient was indicated for total hip arthroplasty. The risks, benefits, and alternatives to the procedure were explained, and the patient elected to proceed. ? ?PROCEDURE IN DETAIL: Surgical site was marked by myself in the pre-op holding area. Once inside the operating room, spinal anesthesia was obtained, and a foley catheter was inserted. The patient was then positioned on the Hana table.  All bony prominences were well padded.  The hip was prepped and draped in the normal sterile surgical fashion.  A time-out was called verifying side and site of surgery. The patient received IV antibiotics within 60 minutes of beginning the procedure. ?  ?Bikini incision was made, and superficial dissection was performed lateral to the ASIS. The direct anterior approach to the hip was performed through the Hueter interval.  Lateral femoral circumflex vessels were treated with the Auqumantys. The anterior capsule was exposed and an inverted T capsulotomy was made. The femoral neck cut was made to the level of the templated cut.  A corkscrew was placed into the head and the head was removed.  The femoral head was  found to have eburnated bone. The head was passed to the back table and was measured. Pubofemoral ligament was released off of the calcar, taking care to stay on bone. Superior capsule was released from the greater trochanter, taking care to stay lateral to the posterior border of the femoral neck in order to preserve the short external rotators. ?  ?Acetabular exposure was achieved, and the pulvinar and labrum were excised. Sequential reaming of the acetabulum was then performed up to a size 51 mm reamer. A 52 mm cup was then opened and impacted into place at approximately 40 degrees of abduction and 20 degrees of anteversion. The final polyethylene liner was impacted into place and acetabular osteophytes were removed.  ?  ?I then gained femoral exposure taking care to protect the abductors and greater trochanter.  This was performed using standard external rotation, extension, and adduction.  A cookie cutter was used to enter the femoral canal, and then the femoral canal finder was placed.  Sequential broaching was performed up to a size 13.  Calcar planer was used on the femoral neck remnant.  I placed a standard offset neck and a trial head ball.  The hip was reduced.  Leg lengths and offset were checked fluoroscopically.  The hip was dislocated and trial components were removed.  The final implants were placed, and the hip was reduced.  Fluoroscopy was used to confirm component position and leg lengths.  At 90 degrees of external rotation and full extension, the hip was stable to an anterior directed force. ?  ?The wound was copiously irrigated with Prontosan solution and normal saline  using pule lavage.  Marcaine solution was injected into the periarticular soft tissue.  The wound was closed in layers using #1 Vicryl and V-Loc for the fascia, 2-0 Vicryl for the subcutaneous fat, 2-0 Monocryl for the deep dermal layer, 3-0 running Monocryl subcuticular stitch, and Dermabond for the skin.  Once the glue was  fully dried, an Aquacell Ag dressing was applied.  The patient was transported to the recovery room in stable condition.  Sponge, needle, and instrument counts were correct at the end of the case x2.  The patient tolerated the procedure well and there were no known complications. ? ?Please note that a surgical assistant was a medical necessity for this procedure to perform it in a safe and expeditious manner. Assistant was necessary to provide appropriate retraction of vital neurovascular structures, to prevent femoral fracture, and to allow for anatomic placement of the prosthesis.  ?

## 2022-02-02 NOTE — Anesthesia Preprocedure Evaluation (Signed)
Anesthesia Evaluation  ?Patient identified by MRN, date of birth, ID band ?Patient awake ? ? ? ?Reviewed: ?Allergy & Precautions, NPO status , Patient's Chart, lab work & pertinent test results ? ?Airway ?Mallampati: II ? ?TM Distance: >3 FB ?Neck ROM: Full ? ? ? Dental ? ?(+) Dental Advisory Given ?  ?Pulmonary ?former smoker,  ?  ?breath sounds clear to auscultation ? ? ? ? ? ? Cardiovascular ?hypertension, Pt. on medications ? ?Rhythm:Regular Rate:Normal ? ? ?  ?Neuro/Psych ?negative neurological ROS ?   ? GI/Hepatic ?Neg liver ROS, GERD  ,  ?Endo/Other  ?Hypothyroidism Morbid obesity ? Renal/GU ?negative Renal ROS  ? ?  ?Musculoskeletal ? ?(+) Arthritis ,  ? Abdominal ?  ?Peds ? Hematology ?negative hematology ROS ?(+)   ?Anesthesia Other Findings ? ? Reproductive/Obstetrics ? ?  ? ? ? ? ? ? ? ? ? ? ? ? ? ?  ?  ? ? ? ? ? ? ? ? ?Anesthesia Physical ? ?Anesthesia Plan ? ?ASA: 2 ? ?Anesthesia Plan: Spinal  ? ?Post-op Pain Management: Toradol IV (intra-op) and Ofirmev IV (intra-op)  ? ?Induction:  ? ?PONV Risk Score and Plan: 2 and Propofol infusion, Ondansetron and Treatment may vary due to age or medical condition ? ?Airway Management Planned: Natural Airway, Simple Face Mask and Nasal Cannula ? ?Additional Equipment: None ? ?Intra-op Plan:  ? ?Post-operative Plan:  ? ?Informed Consent: I have reviewed the patients History and Physical, chart, labs and discussed the procedure including the risks, benefits and alternatives for the proposed anesthesia with the patient or authorized representative who has indicated his/her understanding and acceptance.  ? ? ? ? ? ?Plan Discussed with: Anesthesiologist and CRNA ? ?Anesthesia Plan Comments:   ? ? ? ? ? ? ?Anesthesia Quick Evaluation ? ?

## 2022-02-02 NOTE — Evaluation (Signed)
Physical Therapy Evaluation ?Patient Details ?Name: Hannah Allen ?MRN: [id number] ?DOB: 27-Oct-1966 ?Today's Date: 02/02/2022 ? ?History of Present Illness ? Pt is a 56yo female presenting s/p L-THA, anterior approach. PMH: OA, anxiety, GERD, hyperlipidemia, HTN, hyporthyroidism, R-THA 12/02/2021  ?Clinical Impression ? Hannah Allen is a 56 y.o. female POD 0 s/p the procedure above. Patient reports independence with mobility at baseline. Patient is now limited by functional impairments (see PT problem list below) and requires min assist for bed mobility. Upon initiation of min assist to advance RLE toward EOB, pt experience shooting pain that was unremitting (see bed mobility section below). RN notified and gave pain meds, PT will return for second session once they have reached therapeutic window to attempt same day discharge if appropriate and time allows. Patient will benefit from continued skilled PT interventions to address impairments and progress towards PLOF. We will continue to follow if pt remains in acute care to progress toward  modified independence with mobility.  ? ? ?   ? ?Recommendations for follow up therapy are one component of a multi-disciplinary discharge planning process, led by the attending physician.  Recommendations may be updated based on patient status, additional functional criteria and insurance authorization. ? ?Follow Up Recommendations Follow physician's recommendations for discharge plan and follow up therapies ? ?  ?Assistance Recommended at Discharge Intermittent Supervision/Assistance  ?Patient can return home with the following ? A little help with walking and/or transfers;A little help with bathing/dressing/bathroom;Assistance with cooking/housework;Assist for transportation;Help with stairs or ramp for entrance ? ?  ?Equipment Recommendations None recommended by PT (Pt has recommended DME)  ?Recommendations for Other Services ?    ?  ?Functional Status Assessment Patient has had a  recent decline in their functional status and demonstrates the ability to make significant improvements in function in a reasonable and predictable amount of time.  ? ?  ?Precautions / Restrictions Precautions ?Precautions: Fall ?Restrictions ?Weight Bearing Restrictions: No  ? ?  ? ?Mobility ? Bed Mobility ?Overal bed mobility: Needs Assistance ?Bed Mobility: Supine to Sit ?  ?  ?Supine to sit: Min assist ?  ?  ?General bed mobility comments: Pt min assist for RLE advancement. Upon initiation of movement of RLE, pt reporting shooting/tingling/burning pain in R hip running down to thigh; began showing trembling lower lip and shaking of b/l hands. Pt reporting pain as "40 out of 10" and unable to tolerate positional changes or additional therapy. RN notified and gave pain medication. Further mobility deferred to allow pain medication time to enter therapeutic window. ?  ? ?Transfers ?  ?  ?  ?  ?  ?  ?  ?  ?  ?General transfer comment: deferred ?  ? ?Ambulation/Gait ?  ?  ?  ?  ?  ?  ?  ?General Gait Details: deferred ? ?Stairs ?  ?  ?  ?  ?  ? ?Wheelchair Mobility ?  ? ?Modified Rankin (Stroke Patients Only) ?  ? ?  ? ?Balance   ?  ?  ?  ?  ?  ?  ?  ?  ?  ?  ?  ?  ?  ?  ?  ?  ?  ?  ?   ? ? ? ?Pertinent Vitals/Pain Pain Assessment ?Pain Assessment: 0-10 ?Pain Score: 7  ?Pain Location: L hip ?Pain Descriptors / Indicators: Operative site guarding, Crying, Discomfort, Guarding, Grimacing ?Pain Intervention(s): Limited activity within patient's tolerance, Monitored during session, Repositioned, Ice applied, RN gave pain  meds during session  ? ? ?Home Living Family/patient expects to be discharged to:: Private residence ?Living Arrangements: Alone ?Available Help at Discharge: Family;Available PRN/intermittently (Son at night, daughter during the day) ?Type of Home: Apartment ?Home Access: Level entry ?  ?  ?  ?Home Layout: One level ?Home Equipment: Agricultural consultant (2 wheels);Toilet riser;Cane - single point ?   ?   ?Prior Function Prior Level of Function : Independent/Modified Independent ?  ?  ?  ?  ?  ?  ?Mobility Comments: Uses SPC around the house, RW for longer distance ?ADLs Comments: Son and daughter do all the cooking and housework. Bathes at sink sponge bath ?  ? ? ?Hand Dominance  ? Dominant Hand: Right ? ?  ?Extremity/Trunk Assessment  ? Upper Extremity Assessment ?Upper Extremity Assessment: Overall WFL for tasks assessed ?  ? ?Lower Extremity Assessment ?Lower Extremity Assessment: RLE deficits/detail;LLE deficits/detail ?RLE Deficits / Details: MMT ank pf/df 4/5, good quad control ?RLE Sensation: WNL ?LLE Deficits / Details: MMT ank pf/df 4/5 ?LLE Sensation: WNL ?  ? ?Cervical / Trunk Assessment ?Cervical / Trunk Assessment: Normal  ?Communication  ? Communication: No difficulties  ?Cognition Arousal/Alertness: Awake/alert ?Behavior During Therapy: [facility] for tasks assessed/performed ?Overall Cognitive Status: Within Functional Limits for tasks assessed ?  ?  ?  ?  ?  ?  ?  ?  ?  ?  ?  ?  ?  ?  ?  ?  ?  ?  ?  ? ?  ?General Comments   ? ?  ?Exercises    ? ?Assessment/Plan  ?  ?PT Assessment Patient needs continued PT services  ?PT Problem List Decreased strength;Decreased range of motion;Decreased activity tolerance;Decreased balance;Decreased mobility;Decreased coordination;Pain ? ?   ?  ?PT Treatment Interventions DME instruction;Gait training;Stair training;Functional mobility training;Therapeutic activities;Therapeutic exercise;Balance training;Neuromuscular re-education;Patient/family education   ? ?PT Goals (Current goals can be found in the Care Plan section)  ?Acute Rehab PT Goals ?Patient Stated Goal: To go home without pain ?PT Goal Formulation: With patient ?Time For Goal Achievement: 02/09/22 ?Potential to Achieve Goals: Fair ? ?  ?Frequency 7X/week ?  ? ? ?Co-evaluation   ?  ?  ?  ?  ? ? ?  ?AM-PAC PT "6 Clicks" Mobility  ?Outcome Measure Help needed turning from your back to your side while in a flat  bed without using bedrails?: A Lot ?Help needed moving from lying on your back to sitting on the side of a flat bed without using bedrails?: Total ?Help needed moving to and from a bed to a chair (including a wheelchair)?: Total ?Help needed standing up from a chair using your arms (e.g., wheelchair or bedside chair)?: Total ?Help needed to walk in hospital room?: Total ?Help needed climbing 3-5 steps with a railing? : Total ?6 Click Score: 7 ? ?  ?End of Session   ?Activity Tolerance: Patient limited by pain ?Patient left: in bed;with call bell/phone within reach;with nursing/sitter in room ?Nurse Communication: Patient requests pain meds;Mobility status ?PT Visit Diagnosis: Pain;Difficulty in walking, not elsewhere classified (R26.2) ?Pain - Right/Left: Right ?Pain - part of body: Hip ?  ? ?Time: 1720-1745 ?PT Time Calculation (min) (ACUTE ONLY): 25 min ? ? ?Charges:   PT Evaluation ?$PT Eval Low Complexity: 1 Low ?PT Treatments ?$Therapeutic Activity: 8-22 mins ?  ?   ? ? ?Jamesetta Geralds, PT, DPT ?[facility] ?Office: [phone] ?Pager: [phone] ? ?Jamesetta Geralds ?02/02/2022, 7:17 PM ?

## 2022-02-02 NOTE — Anesthesia Procedure Notes (Signed)
Spinal ? ?Patient location during procedure: OR ?Start time: 02/02/2022 12:35 PM ?End time: 02/02/2022 12:39 PM ?Reason for block: surgical anesthesia ?Staffing ?Anesthesiologist: Janeece Riggers, MD ?Preanesthetic Checklist ?Completed: patient identified, IV checked, site marked, risks and benefits discussed, surgical consent, monitors and equipment checked, pre-op evaluation and timeout performed ?Spinal Block ?Patient position: sitting ?Prep: DuraPrep ?Patient monitoring: heart rate, cardiac monitor, continuous pulse ox and blood pressure ?Approach: midline ?Location: L3-4 ?Injection technique: single-shot ?Needle ?Needle type: Sprotte  ?Needle gauge: 24 G ?Needle length: 9 cm ?Assessment ?Sensory level: T4 ?Events: CSF return ? ? ? ?

## 2022-02-02 NOTE — Anesthesia Postprocedure Evaluation (Signed)
Anesthesia Post Note ? ?Patient: Evona Alstrom ? ?Procedure(s) Performed: TOTAL HIP ARTHROPLASTY ANTERIOR APPROACH (Left: Hip) ? ?  ? ?Patient location during evaluation: PACU ?Anesthesia Type: Spinal ?Level of consciousness: oriented and awake and alert ?Pain management: pain level controlled ?Vital Signs Assessment: post-procedure vital signs reviewed and stable ?Respiratory status: spontaneous breathing, respiratory function stable and patient connected to nasal cannula oxygen ?Cardiovascular status: blood pressure returned to baseline and stable ?Postop Assessment: no headache, no backache and no apparent nausea or vomiting ?Anesthetic complications: no ? ? ?No notable events documented. ? ?Last Vitals:  ?Vitals:  ? 02/02/22 1545 02/02/22 1600  ?BP: 93/73 111/76  ?Pulse: 82 60  ?Resp: 20 11  ?Temp:  (!) 36.1 ?C  ?SpO2: 97% 96%  ?  ?Last Pain:  ?Vitals:  ? 02/02/22 1600  ?TempSrc:   ?PainSc: Asleep  ? ? ?LLE Motor Response: Purposeful movement (02/02/22 1600) ?LLE Sensation: Decreased;Tingling (02/02/22 1600) ?RLE Motor Response: Purposeful movement (02/02/22 1600) ?RLE Sensation: Decreased;Tingling (02/02/22 1600) ?L Sensory Level: S3-Medial thigh (02/02/22 1600) ?R Sensory Level: S3-Medial thigh (02/02/22 1600) ? ?Hines Kloss ? ? ? ? ?

## 2022-02-02 NOTE — Progress Notes (Signed)
Physical Therapy Treatment ?Patient Details ?Name: Hannah Allen ?MRN: [id number] ?DOB: July 20, 1966 ?Today's Date: 02/02/2022 ? ? ?History of Present Illness Pt is a 56yo female presenting s/p L-THA, anterior approach. PMH: OA, anxiety, GERD, hyperlipidemia, HTN, hyporthyroidism, R-THA 12/02/2021 ? ?  ?PT Comments  ? ? Pt seen in PACU for second visit today with goal of same day discharge s/p procedure above. Pt reporting pain meds are working and pain is now tolerable for mobility. Patient was able to ambulate 50 feet with RW and min guard with cues for safe walker management. Patient instructed in exercises to facilitate ROM and circulation. Provided HEP handout and discussed exercises with appropriate progressions, especially walking program. Pt had opportunity to ask questions and were answered to their satisfaction. Patient will benefit from continued skilled PT interventions to address impairments and progress towards PLOF. Patient has met mobility goals at adequate level for discharge home; will continue to follow if pt continues acute stay to progress towards Mod I goals. ?   ?Recommendations for follow up therapy are one component of a multi-disciplinary discharge planning process, led by the attending physician.  Recommendations may be updated based on patient status, additional functional criteria and insurance authorization. ? ?Follow Up Recommendations ? Follow physician's recommendations for discharge plan and follow up therapies ?  ?  ?Assistance Recommended at Discharge Intermittent Supervision/Assistance  ?Patient can return home with the following A little help with walking and/or transfers;A little help with bathing/dressing/bathroom;Assistance with cooking/housework;Assist for transportation;Help with stairs or ramp for entrance ?  ?Equipment Recommendations ? None recommended by PT (Pt has recommended DME)  ?  ?Recommendations for Other Services   ? ? ?  ?Precautions / Restrictions  Precautions ?Precautions: Fall ?Restrictions ?Weight Bearing Restrictions: No  ?  ? ?Mobility ? Bed Mobility ?Overal bed mobility: Needs Assistance ?Bed Mobility: Supine to Sit ?  ?  ?Supine to sit: Min assist, HOB elevated ?  ?  ?General bed mobility comments: Pt min assist for LLE advancement with increased time, pt required many rest breaks to be able to bring leg over EOB. ?  ? ?Transfers ?Overall transfer level: Needs assistance ?Equipment used: Rolling walker (2 wheels) ?Transfers: Sit to/from Stand ?Sit to Stand: Min assist ?  ?  ?  ?  ?  ?General transfer comment: Pt min assist for sit to stand transfer for steadying with max multimodal cuing for sequencing and hand placement. Pt demonstrating high anxiety level but with pursed lip breathing and encouragement was able to come to standing and weightbear on LLE appropriately. Standing marches completed as well. ?  ? ?Ambulation/Gait ?Ambulation/Gait assistance: Min guard ?Gait Distance (Feet): 50 Feet ?Assistive device: Rolling walker (2 wheels) ?Gait Pattern/deviations: Step-to pattern, Decreased step length - left, Decreased stance time - left, Decreased weight shift to left ?Gait velocity: decreased ?  ?  ?General Gait Details: Pt ambulated with min guard assist, no physical assist required. Demonstrated decreased stance time on L and step-through pattern and heavy BUE use on RW. Pt required many standing rest breaks and ambulated at very slow speed that increased over course or mobility task. No overt LOB noted. ? ? ?Stairs ?  ?  ?  ?  ?  ? ? ?Wheelchair Mobility ?  ? ?Modified Rankin (Stroke Patients Only) ?  ? ? ?  ?Balance Overall balance assessment: Needs assistance ?Sitting-balance support: Feet supported, Bilateral upper extremity supported ?Sitting balance-Leahy Scale: Fair ?  ?  ?Standing balance support: Bilateral upper extremity supported, During functional  activity, Reliant on assistive device for balance ?Standing balance-Leahy Scale: Poor ?  ?   ?  ?  ?  ?  ?  ?  ?  ?  ?  ?  ?  ? ?  ?Cognition Arousal/Alertness: Awake/alert ?Behavior During Therapy: [facility] for tasks assessed/performed ?Overall Cognitive Status: Within Functional Limits for tasks assessed ?  ?  ?  ?  ?  ?  ?  ?  ?  ?  ?  ?  ?  ?  ?  ?  ?  ?  ?  ? ?  ?Exercises Total Joint Exercises ?Ankle Circles/Pumps: AROM, Both, 20 reps ?Quad Sets: AROM, Left, 5 reps ?Short Arc Quad: Other (comment) (Described, not performed.) ?Heel Slides:  (Described, not performed.) ?Hip ABduction/ADduction:  (Described, not performed.) ?Long [employer]:  (Described, not performed.) ?Knee Flexion:  (Described, not performed.) ?Marching in Standing:  (Described, not performed.) ? ?  ?General Comments General comments (skin integrity, edema, etc.): Pt generally anxious but responded to encouragement and celebration of acheivements ?  ?  ? ?Pertinent Vitals/Pain Pain Assessment ?Pain Assessment: 0-10 ?Pain Score: 4  ?Pain Location: L hip ?Pain Descriptors / Indicators: Operative site guarding, Crying, Discomfort, Guarding, Grimacing ?Pain Intervention(s): Monitored during session, Repositioned  ? ? ?Home Living Family/patient expects to be discharged to:: Private residence ?Living Arrangements: Alone ?Available Help at Discharge: Family;Available PRN/intermittently (Son at night, daughter during the day) ?Type of Home: Apartment ?Home Access: Level entry ?  ?  ?  ?Home Layout: One level ?Home Equipment: Conservation officer, nature (2 wheels);Toilet riser;Cane - single point ?   ?  ?Prior Function    ?  ?  ?   ? ?PT Goals (current goals can now be found in the care plan section) Acute Rehab PT Goals ?Patient Stated Goal: To go home without pain ?PT Goal Formulation: With patient ?Time For Goal Achievement: 02/09/22 ?Potential to Achieve Goals: Fair ?Progress towards PT goals: Progressing toward goals ? ?  ?Frequency ? ? ? 7X/week ? ? ? ?  ?PT Plan Current plan remains appropriate  ? ? ?Co-evaluation   ?  ?  ?  ?  ? ?  ?AM-PAC PT "6 Clicks"  Mobility   ?Outcome Measure ? Help needed turning from your back to your side while in a flat bed without using bedrails?: A Little ?Help needed moving from lying on your back to sitting on the side of a flat bed without using bedrails?: A Lot ?Help needed moving to and from a bed to a chair (including a wheelchair)?: A Little ?Help needed standing up from a chair using your arms (e.g., wheelchair or bedside chair)?: A Little ?Help needed to walk in hospital room?: A Little ?Help needed climbing 3-5 steps with a railing? : A Little ?6 Click Score: 17 ? ?  ?End of Session Equipment Utilized During Treatment: Gait belt ?Activity Tolerance: Patient tolerated treatment well;No increased pain ?Patient left: with call bell/phone within reach;with nursing/sitter in room;in chair ?Nurse Communication: Mobility status ?PT Visit Diagnosis: Pain;Difficulty in walking, not elsewhere classified (R26.2) ?Pain - Right/Left: Right ?Pain - part of body: Hip ?  ? ? ?Time: 1820-1910 ?PT Time Calculation (min) (ACUTE ONLY): 50 min ? ?Charges:  $Gait Training: 23-37 mins ?$Therapeutic Activity: 8-22 mins ?$Self Care/Home Management: 8-22          ?          ?Coolidge Breeze, PT, DPT ?[facility] ?Office: [phone] ?Pager: [phone] ? ? ?Gwyndolyn Saxon  Sheppard Plumber ?02/02/2022, 7:29 PM ? ?

## 2022-02-02 NOTE — Transfer of Care (Signed)
Immediate Anesthesia Transfer of Care Note ? ?Patient: Hannah Allen ? ?Procedure(s) Performed: Procedure(s) with comments: ?TOTAL HIP ARTHROPLASTY ANTERIOR APPROACH (Left) - 150 ? ?Patient Location: PACU ? ?Anesthesia Type:Spinal ? ?Level of Consciousness: awake, alert  and oriented ? ?Airway & Oxygen Therapy: Patient Spontanous Breathing ? ?Post-op Assessment: Report given to RN and Post -op Vital signs reviewed and stable ? ?Post vital signs: Reviewed and stable ? ?Last Vitals:  ?Vitals:  ? 02/02/22 0926  ?BP: 130/75  ?Pulse: 71  ?Resp: 18  ?Temp: 36.6 ?C  ?SpO2: 100%  ? ? ?Complications: No apparent anesthesia complications ? ?

## 2022-02-02 NOTE — H&P (Signed)
TOTAL HIP ADMISSION H&P ?  ?Patient is admitted for left total hip arthroplasty. ?  ?Subjective: ?  ?Chief Complaint: left hip pain ?  ?HPI: Hannah Allen, 56 y.o. female, has a history of pain and functional disability in the left hip(s) due to arthritis and patient has failed non-surgical conservative treatments for greater than 12 weeks to include NSAID's and/or analgesics, flexibility and strengthening excercises, supervised PT with diminished ADL's post treatment, use of assistive devices, weight reduction as appropriate, and activity modification.  Onset of symptoms was gradual starting 2 years ago with gradually worsening course since that time.The patient noted no past surgery on the left hip(s).  Patient currently rates pain in the left hip at 10 out of 10 with activity. Patient has night pain, worsening of pain with activity and weight bearing, trendelenberg gait, pain that interfers with activities of daily living, and pain with passive range of motion. Patient has evidence of subchondral cysts, subchondral sclerosis, periarticular osteophytes, and joint space narrowing by imaging studies. This condition presents safety issues increasing the risk of falls. There is no current active infection. ?  ?    ?Patient Active Problem List  ?  Diagnosis Date Noted  ? Essential hypertension 09/18/2018  ? Xerosis of skin 10/01/2016  ? BMI 40.0-44.9, adult ([place]) 10/01/2016  ? Varicose veins of lower extremities with other complications [id number]  ? Sinusitis 06/10/2012  ? Varicose veins 06/10/2012  ? Anxiety 06/10/2012  ? Elevated blood pressure reading 06/10/2012  ? Ankle pain 05/02/2012  ? Mixed hyperlipidemia 04/25/2012  ? Leg swelling 04/24/2012  ? Morbid obesity ([place]) 04/24/2012  ? Back pain 04/24/2012  ? SOB (shortness of breath) 04/24/2012  ? GERD (gastroesophageal reflux disease) 04/24/2012  ? Seasonal allergies 04/24/2012  ? Knee pain, acute 04/24/2012  ?  ?    ?Past Medical History:  ?Diagnosis Date  ?  Allergy    ? Anxiety    ? Arthritis    ? GERD (gastroesophageal reflux disease)    ? Headache    ? Hyperlipidemia    ? Hypertension    ? Hypothyroidism    ? Pre-diabetes    ? Varicose veins    ?  ?     ?Past Surgical History:  ?Procedure Laterality Date  ? CHOLECYSTECTOMY      ? TUBAL LIGATION      ?  ?         ?Current Facility-Administered Medications  ?Medication Dose Route Frequency Provider Last Rate Last Admin  ? 0.9 %  sodium chloride infusion   Intravenous Continuous Laraina Sulton, Aaron Edelman, MD      ? ceFAZolin (ANCEF) IVPB 2g/100 mL premix  2 g Intravenous On Call to [place], MD      ? lactated ringers infusion   Intravenous Continuous Annye Asa, MD 10 mL/hr at 12/02/21 0604 Continued from Pre-op at 12/02/21 0604  ? tranexamic acid (CYKLOKAPRON) IVPB 1,000 mg  1,000 mg Intravenous To OR Rod Can, MD      ?  ?     ?Allergies  ?Allergen Reactions  ? Lisinopril    ?    ?angioedema  ?  ?Social History  ?  ?     ?Tobacco Use  ? Smoking status: Former  ?    Types: Cigarettes  ?    Quit date: 08/11/2011  ?    Years since quitting: 10.3  ? Smokeless tobacco: Never  ? Tobacco comments:  ?    quit 6 months  ?  Substance Use Topics  ? Alcohol use: No  ?  ?     ?Family History  ?Problem Relation Age of Onset  ? Heart disease Maternal Grandmother    ? Diabetes Maternal Grandmother    ?  ?  ?Review of Systems  ?Cardiovascular:  Positive for leg swelling.  ?Musculoskeletal:  Positive for back pain, gait problem and joint swelling.  ?All other systems reviewed and are negative. ?  ?Objective: ?  ?Physical Exam ?Constitutional:   ?   Appearance: Normal appearance.  ?HENT:  ?   Head: Normocephalic and atraumatic.  ?   Nose: Nose normal.  ?   Mouth/Throat:  ?   Mouth: Mucous membranes are moist.  ?   Pharynx: Oropharynx is clear.  ?Eyes:  ?   Extraocular Movements: Extraocular movements intact.  ?   Pupils: Pupils are equal, round, and reactive to light.  ?Cardiovascular:  ?   Rate and Rhythm: Normal rate and  regular rhythm.  ?   Pulses: Normal pulses.  ?Pulmonary:  ?   Effort: Pulmonary effort is normal. No respiratory distress.  ?Abdominal:  ?   General: Abdomen is flat.  ?   Palpations: Abdomen is soft.  ?Genitourinary: ?   Comments: deferred ?Musculoskeletal:  ?   Cervical back: Normal range of motion.  ?   Left hip: Bony tenderness present. Decreased range of motion. Decreased strength.  ?Skin: ?   General: Skin is warm and dry.  ?   Capillary Refill: Capillary refill takes less than 2 seconds.  ?Neurological:  ?   General: No focal deficit present.  ?   Mental Status: She is alert and oriented to person, place, and time.  ?Psychiatric:     ?   Mood and Affect: Mood normal.     ?   Behavior: Behavior normal.     ?   Thought Content: Thought content normal.     ?   Judgment: Judgment normal.  ?  ?  ?Vital signs in last 24 hours: ?Temp:  [98.7 ?F (37.1 ?C)] 98.7 ?F (37.1 ?C) (02/02 [id number]) ?Pulse Rate:  [72] 72 (02/02 0617) ?Resp:  [18] 18 (02/02 0617) ?BP: (134)/(77) 134/77 (02/02 0617) ?SpO2:  [99 %] 99 % (02/02 0617) ?Weight:  [101.7 kg] 101.7 kg (02/02 0543) ?  ?Labs: ?  ?  ?Estimated body mass index is 39.72 kg/m? as calculated from the following: ?  Height as of this encounter: 5\' 3"  (1.6 m). ?  Weight as of this encounter: 101.7 kg. ?  ?  ?Imaging Review ?Plain radiographs demonstrate severe degenerative joint disease of the left hip(s). The bone quality appears to be adequate for age and reported activity level. ?  ?  ?  ?  ?  ?Assessment/Plan: ?  ?End stage arthritis, left hip(s) ?  ?The patient history, physical examination, clinical judgement of the provider and imaging studies are consistent with end stage degenerative joint disease of the left hip(s) and total hip arthroplasty is deemed medically necessary. The treatment options including medical management, injection therapy, arthroscopy and arthroplasty were discussed at length. The risks and benefits of total hip arthroplasty were presented and reviewed.  The risks due to aseptic loosening, infection, stiffness, dislocation/subluxation,  thromboembolic complications and other imponderables were discussed.  The patient acknowledged the explanation, agreed to proceed with the plan and consent was signed. Patient is being admitted for inpatient treatment for surgery, pain control, PT, OT, prophylactic antibiotics, VTE prophylaxis, progressive ambulation and ADL's and discharge  planning.The patient is planning to be discharged  home w HEP ?  ?  ?  ?Patient's anticipated LOS is less than 2 midnights, meeting these requirements: ?- Younger than 73 ?- Lives within 1 hour of care ?- Has a competent adult at home to recover with post-op recover ?- NO history of ?            - Chronic pain requiring opiods ?            - Diabetes ?            - Coronary Artery Disease ?            - Heart failure ?            - Heart attack ?            - Stroke ?            - DVT/VTE ?            - Cardiac arrhythmia ?            - Respiratory Failure/COPD ?            - Renal failure ?            - Anemia ?            - Advanced Liver disease ?

## 2022-02-02 NOTE — Discharge Instructions (Signed)
? ?Dr. Dnya Hickle ?Joint Replacement Specialist ?[facility] ?[address] ?[place], [place] [address] ?[phone] ? ? ?TOTAL HIP REPLACEMENT POSTOPERATIVE DIRECTIONS ? ? ? ?Hip Rehabilitation, Guidelines Following Surgery  ? ?WEIGHT BEARING ?Weight bearing as tolerated with assist device (walker, cane, etc) as directed, use it as long as suggested by your surgeon or therapist, typically at least 4-6 weeks. ? ?The results of a hip operation are greatly improved after range of motion and muscle strengthening exercises. Follow all safety measures which are given to protect your hip. If any of these exercises cause increased pain or swelling in your joint, decrease the amount until you are comfortable again. Then slowly increase the exercises. Call your caregiver if you have problems or questions.  ? ?HOME CARE INSTRUCTIONS  ?Most of the following instructions are designed to prevent the dislocation of your new hip.  ?Remove items at home which could result in a fall. This includes throw rugs or furniture in walking pathways.  ?Continue medications as instructed at time of discharge. ?You may have some home medications which will be placed on hold until you complete the course of blood thinner medication. ?You may start showering once you are discharged home. Do not remove your dressing. ?Do not put on socks or shoes without following the instructions of your caregivers.   ?Sit on chairs with arms. Use the chair arms to help push yourself up when arising.  ?Arrange for the use of a toilet seat elevator so you are not sitting low.  ?Walk with walker as instructed.  ?You may resume a sexual relationship in one month or when given the OK by your caregiver.  ?Use walker as long as suggested by your caregivers.  ?You may put full weight on your legs and walk as much as is comfortable. ?Avoid periods of inactivity such as sitting longer than an hour when not asleep. This helps prevent blood  clots.  ?You may return to work once you are cleared by your surgeon.  ?Do not drive a car for 6 weeks or until released by your surgeon.  ?Do not drive while taking narcotics.  ?Wear elastic stockings for two weeks following surgery during the day but you may remove then at night.  ?Make sure you keep all of your appointments after your operation with all of your doctors and caregivers. You should call the office at the above phone number and make an appointment for approximately two weeks after the date of your surgery. ?Please pick up a stool softener and laxative for home use as long as you are requiring pain medications. ?ICE to the affected hip every three hours for 30 minutes at a time and then as needed for pain and swelling. Continue to use ice on the hip for pain and swelling from surgery. You may notice swelling that will progress down to the foot and ankle.  This is normal after surgery.  Elevate the leg when you are not up walking on it.   ?It is important for you to complete the blood thinner medication as prescribed by your doctor. ?Continue to use the breathing machine which will help keep your temperature down.  It is common for your temperature to cycle up and down following surgery, especially at night when you are not up moving around and exerting yourself.  The breathing machine keeps your lungs expanded and your temperature down. ? ?RANGE OF MOTION AND STRENGTHENING EXERCISES  ?These exercises are designed to help you   keep full movement of your hip joint. Follow your caregiver's or physical therapist's instructions. Perform all exercises about fifteen times, three times per day or as directed. Exercise both hips, even if you have had only one joint replacement. These exercises can be done on a training (exercise) mat, on the floor, on a table or on a bed. Use whatever works the best and is most comfortable for you. Use music or television while you are exercising so that the exercises are a  pleasant break in your day. This will make your life better with the exercises acting as a break in routine you can look forward to.  ?Lying on your back, slowly slide your foot toward your buttocks, raising your knee up off the floor. Then slowly slide your foot back down until your leg is straight again.  ?Lying on your back spread your legs as far apart as you can without causing discomfort.  ?Lying on your side, raise your upper leg and foot straight up from the floor as far as is comfortable. Slowly lower the leg and repeat.  ?Lying on your back, tighten up the muscle in the front of your thigh (quadriceps muscles). You can do this by keeping your leg straight and trying to raise your heel off the floor. This helps strengthen the largest muscle supporting your knee.  ?Lying on your back, tighten up the muscles of your buttocks both with the legs straight and with the knee bent at a comfortable angle while keeping your heel on the floor.  ? ?SKILLED REHAB INSTRUCTIONS: ?If the patient is transferred to a skilled rehab facility following release from the hospital, a list of the current medications will be sent to the facility for the patient to continue.  When discharged from the skilled rehab facility, please have the facility set up the patient's Home Health Physical Therapy prior to being released. Also, the skilled facility will be responsible for providing the patient with their medications at time of release from the facility to include their pain medication and their blood thinner medication. If the patient is still at the rehab facility at time of the two week follow up appointment, the skilled rehab facility will also need to assist the patient in arranging follow up appointment in our office and any transportation needs. ? ?POST-OPERATIVE OPIOID TAPER INSTRUCTIONS: ?It is important to wean off of your opioid medication as soon as possible. If you do not need pain medication after your surgery it is ok  to stop day one. ?Opioids include: ?Codeine, Hydrocodone(Norco, Vicodin), Oxycodone(Percocet, oxycontin) and hydromorphone amongst others.  ?Long term and even short term use of opiods can cause: ?Increased pain response ?Dependence ?Constipation ?Depression ?Respiratory depression ?And more.  ?Withdrawal symptoms can include ?Flu like symptoms ?Nausea, vomiting ?And more ?Techniques to manage these symptoms ?Hydrate well ?Eat regular healthy meals ?Stay active ?Use relaxation techniques(deep breathing, meditating, yoga) ?Do Not substitute Alcohol to help with tapering ?If you have been on opioids for less than two weeks and do not have pain than it is ok to stop all together.  ?Plan to wean off of opioids ?This plan should start within one week post op of your joint replacement. ?Maintain the same interval or time between taking each dose and first decrease the dose.  ?Cut the total daily intake of opioids by one tablet each day ?Next start to increase the time between doses. ?The last dose that should be eliminated is the evening dose.  ? ? ?MAKE   SURE YOU:  ?Understand these instructions.  ?Will watch your condition.  ?Will get help right away if you are not doing well or get worse. ? ?Pick up stool softner and laxative for home use following surgery while on pain medications. ?Do not remove your dressing. ?The dressing is waterproof--it is OK to take showers. ?Continue to use ice for pain and swelling after surgery. ?Do not use any lotions or creams on the incision until instructed by your surgeon. ?Total Hip Protocol. ? ?

## 2022-02-04 ENCOUNTER — Encounter (HOSPITAL_COMMUNITY): Payer: Self-pay | Admitting: Orthopedic Surgery

## 2022-07-20 ENCOUNTER — Other Ambulatory Visit: Payer: Self-pay | Admitting: Nurse Practitioner

## 2022-07-20 ENCOUNTER — Other Ambulatory Visit (HOSPITAL_COMMUNITY): Payer: Self-pay | Admitting: Nurse Practitioner

## 2022-07-20 DIAGNOSIS — M542 Cervicalgia: Secondary | ICD-10-CM

## 2022-07-20 DIAGNOSIS — I714 Abdominal aortic aneurysm, without rupture, unspecified: Secondary | ICD-10-CM

## 2022-07-29 ENCOUNTER — Ambulatory Visit (HOSPITAL_COMMUNITY)
Admission: RE | Admit: 2022-07-29 | Discharge: 2022-07-29 | Disposition: A | Discharge status: Home / Self Care | Payer: No Typology Code available for payment source | Source: Ambulatory Visit | Attending: Nurse Practitioner | Admitting: Nurse Practitioner

## 2022-07-29 DIAGNOSIS — M542 Cervicalgia: Secondary | ICD-10-CM | POA: Insufficient documentation

## 2022-08-12 ENCOUNTER — Ambulatory Visit (HOSPITAL_COMMUNITY)
Admission: RE | Admit: 2022-08-12 | Discharge: 2022-08-12 | Disposition: A | Discharge status: Home / Self Care | Payer: No Typology Code available for payment source | Source: Ambulatory Visit | Attending: Nurse Practitioner | Admitting: Nurse Practitioner

## 2022-08-12 DIAGNOSIS — I714 Abdominal aortic aneurysm, without rupture, unspecified: Secondary | ICD-10-CM | POA: Insufficient documentation

## 2022-12-25 ENCOUNTER — Encounter (HOSPITAL_COMMUNITY): Payer: Self-pay | Admitting: Emergency Medicine

## 2022-12-25 ENCOUNTER — Other Ambulatory Visit: Payer: Self-pay

## 2022-12-25 ENCOUNTER — Emergency Department (HOSPITAL_COMMUNITY): Payer: No Typology Code available for payment source

## 2022-12-25 ENCOUNTER — Inpatient Hospital Stay (HOSPITAL_COMMUNITY)
Admission: EM | Admit: 2022-12-25 | Discharge: 2022-12-30 | DRG: 683 | Disposition: A | Discharge status: Home / Self Care | Payer: No Typology Code available for payment source | Attending: Family Medicine | Admitting: Family Medicine

## 2022-12-25 DIAGNOSIS — R590 Localized enlarged lymph nodes: Secondary | ICD-10-CM | POA: Diagnosis present

## 2022-12-25 DIAGNOSIS — R9431 Abnormal electrocardiogram [ECG] [EKG]: Secondary | ICD-10-CM | POA: Diagnosis present

## 2022-12-25 DIAGNOSIS — R591 Generalized enlarged lymph nodes: Secondary | ICD-10-CM | POA: Diagnosis present

## 2022-12-25 DIAGNOSIS — I10 Essential (primary) hypertension: Secondary | ICD-10-CM | POA: Diagnosis present

## 2022-12-25 DIAGNOSIS — R112 Nausea with vomiting, unspecified: Secondary | ICD-10-CM | POA: Diagnosis not present

## 2022-12-25 DIAGNOSIS — Z833 Family history of diabetes mellitus: Secondary | ICD-10-CM | POA: Diagnosis not present

## 2022-12-25 DIAGNOSIS — Z79899 Other long term (current) drug therapy: Secondary | ICD-10-CM | POA: Diagnosis not present

## 2022-12-25 DIAGNOSIS — E876 Hypokalemia: Secondary | ICD-10-CM | POA: Diagnosis present

## 2022-12-25 DIAGNOSIS — N179 Acute kidney failure, unspecified: Secondary | ICD-10-CM | POA: Diagnosis not present

## 2022-12-25 DIAGNOSIS — E871 Hypo-osmolality and hyponatremia: Secondary | ICD-10-CM

## 2022-12-25 DIAGNOSIS — Z8616 Personal history of COVID-19: Secondary | ICD-10-CM | POA: Diagnosis not present

## 2022-12-25 DIAGNOSIS — Z8249 Family history of ischemic heart disease and other diseases of the circulatory system: Secondary | ICD-10-CM | POA: Diagnosis not present

## 2022-12-25 DIAGNOSIS — R197 Diarrhea, unspecified: Secondary | ICD-10-CM

## 2022-12-25 DIAGNOSIS — E785 Hyperlipidemia, unspecified: Secondary | ICD-10-CM | POA: Diagnosis present

## 2022-12-25 DIAGNOSIS — Z96641 Presence of right artificial hip joint: Secondary | ICD-10-CM | POA: Diagnosis present

## 2022-12-25 DIAGNOSIS — A020 Salmonella enteritis: Secondary | ICD-10-CM | POA: Diagnosis present

## 2022-12-25 DIAGNOSIS — Z87891 Personal history of nicotine dependence: Secondary | ICD-10-CM | POA: Diagnosis not present

## 2022-12-25 DIAGNOSIS — E860 Dehydration: Secondary | ICD-10-CM | POA: Diagnosis present

## 2022-12-25 DIAGNOSIS — K219 Gastro-esophageal reflux disease without esophagitis: Secondary | ICD-10-CM | POA: Diagnosis present

## 2022-12-25 DIAGNOSIS — Z791 Long term (current) use of non-steroidal anti-inflammatories (NSAID): Secondary | ICD-10-CM | POA: Diagnosis not present

## 2022-12-25 DIAGNOSIS — F419 Anxiety disorder, unspecified: Secondary | ICD-10-CM | POA: Diagnosis present

## 2022-12-25 DIAGNOSIS — Z888 Allergy status to other drugs, medicaments and biological substances status: Secondary | ICD-10-CM | POA: Diagnosis not present

## 2022-12-25 DIAGNOSIS — E861 Hypovolemia: Secondary | ICD-10-CM | POA: Diagnosis present

## 2022-12-25 DIAGNOSIS — K529 Noninfective gastroenteritis and colitis, unspecified: Secondary | ICD-10-CM

## 2022-12-25 HISTORY — DX: COVID-19: U07.1

## 2022-12-25 LAB — COMPREHENSIVE METABOLIC PANEL
ALT: 31 U/L (ref 0–44)
AST: 33 U/L (ref 15–41)
Albumin: 3.7 g/dL (ref 3.5–5.0)
Alkaline Phosphatase: 74 U/L (ref 38–126)
Anion gap: 20 — ABNORMAL HIGH (ref 5–15)
BUN: 49 mg/dL — ABNORMAL HIGH (ref 6–20)
CO2: 27 mmol/L (ref 22–32)
Calcium: 9.2 mg/dL (ref 8.9–10.3)
Chloride: 80 mmol/L — ABNORMAL LOW (ref 98–111)
Creatinine, Ser: 2.09 mg/dL — ABNORMAL HIGH (ref 0.44–1.00)
GFR, Estimated: 27 mL/min — ABNORMAL LOW (ref >60)
Glucose, Bld: 153 mg/dL — ABNORMAL HIGH (ref 70–99)
Potassium: 2.1 mmol/L — CL (ref 3.5–5.1)
Sodium: 127 mmol/L — ABNORMAL LOW (ref 135–145)
Total Bilirubin: 0.8 mg/dL (ref 0.3–1.2)
Total Protein: 7.9 g/dL (ref 6.5–8.1)

## 2022-12-25 LAB — CBC
HCT: 43.8 % (ref 36.0–46.0)
Hemoglobin: 15.3 g/dL — ABNORMAL HIGH (ref 12.0–15.0)
MCH: 27.1 pg (ref 26.0–34.0)
MCHC: 34.9 g/dL (ref 30.0–36.0)
MCV: 77.5 fL — ABNORMAL LOW (ref 80.0–100.0)
Platelets: 427 10*3/uL — ABNORMAL HIGH (ref 150–400)
RBC: 5.65 MIL/uL — ABNORMAL HIGH (ref 3.87–5.11)
RDW: 13.6 % (ref 11.5–15.5)
WBC: 14.4 10*3/uL — ABNORMAL HIGH (ref 4.0–10.5)
nRBC: 0 % (ref 0.0–0.2)

## 2022-12-25 LAB — MAGNESIUM: Magnesium: 2.4 mg/dL (ref 1.7–2.4)

## 2022-12-25 LAB — LIPASE, BLOOD: Lipase: 255 U/L — ABNORMAL HIGH (ref 11–51)

## 2022-12-25 MED ORDER — ACETAMINOPHEN 650 MG RE SUPP: 650.0000 mg | Freq: Four times a day (QID) | RECTAL | Status: DC | PRN

## 2022-12-25 MED ORDER — HEPARIN SODIUM (PORCINE) 5000 UNIT/ML IJ SOLN
5000.0000 [IU] | Freq: Three times a day (TID) | INTRAMUSCULAR | Status: DC
  Administered 2022-12-25 – 2022-12-30 (×12): 5000 [IU] via SUBCUTANEOUS
  Filled 2022-12-25 (×11): qty 1

## 2022-12-25 MED ORDER — LACTATED RINGERS IV SOLN: INTRAVENOUS | Status: AC

## 2022-12-25 MED ORDER — ACETAMINOPHEN 325 MG PO TABS: 650.0000 mg | ORAL_TABLET | Freq: Four times a day (QID) | ORAL | Status: DC | PRN

## 2022-12-25 MED ORDER — PANTOPRAZOLE SODIUM 40 MG PO TBEC
40.0000 mg | DELAYED_RELEASE_TABLET | Freq: Every day | ORAL | Status: DC
  Administered 2022-12-26 – 2022-12-28 (×2): 40 mg via ORAL
  Filled 2022-12-25 (×3): qty 1

## 2022-12-25 MED ORDER — ATORVASTATIN CALCIUM 10 MG PO TABS
20.0000 mg | ORAL_TABLET | Freq: Every day | ORAL | Status: DC
  Administered 2022-12-26 – 2022-12-30 (×4): 20 mg via ORAL
  Filled 2022-12-25 (×4): qty 2

## 2022-12-25 MED ORDER — SODIUM CHLORIDE 0.9% FLUSH
3.0000 mL | Freq: Two times a day (BID) | INTRAVENOUS | Status: DC
  Administered 2022-12-25 – 2022-12-30 (×10): 3 mL via INTRAVENOUS

## 2022-12-25 MED ORDER — ONDANSETRON HCL 4 MG/2ML IJ SOLN
4.0000 mg | Freq: Once | INTRAMUSCULAR | Status: AC
  Administered 2022-12-25: 4 mg via INTRAVENOUS
  Filled 2022-12-25: qty 2

## 2022-12-25 MED ORDER — POTASSIUM CHLORIDE CRYS ER 20 MEQ PO TBCR
40.0000 meq | EXTENDED_RELEASE_TABLET | Freq: Once | ORAL | Status: DC
  Filled 2022-12-25: qty 2

## 2022-12-25 MED ORDER — SODIUM CHLORIDE 0.9 % IV BOLUS
2000.0000 mL | Freq: Once | INTRAVENOUS | Status: AC
  Administered 2022-12-25: 2000 mL via INTRAVENOUS

## 2022-12-25 MED ORDER — LACTATED RINGERS IV BOLUS: 2000.0000 mL | Freq: Once | INTRAVENOUS | Status: DC

## 2022-12-25 MED ORDER — MAGNESIUM SULFATE 2 GM/50ML IV SOLN
2.0000 g | Freq: Once | INTRAVENOUS | Status: DC
  Filled 2022-12-25: qty 50

## 2022-12-25 MED ORDER — POTASSIUM CHLORIDE 10 MEQ/100ML IV SOLN
10.0000 meq | INTRAVENOUS | Status: AC
  Administered 2022-12-25 (×4): 10 meq via INTRAVENOUS
  Filled 2022-12-25 (×4): qty 100

## 2022-12-25 NOTE — ED Triage Notes (Signed)
Patient c/o generalized weakness with nausea, vomiting, and diarrhea. Denies any fevers or urinary symptoms. Patient diagnosed with Covid on 12/11/2022 was seen at [facility]  2/18 and given IVF and medications. Per patient still increasing getting weak with no relief of symptoms. Patient reports falling twice today from weakness. Patient states hit nose. Denies any LOC. Denies any taking anticoagulants.

## 2022-12-25 NOTE — ED Provider Notes (Signed)
[facility] Provider Note   CSN: [id number] Arrival date & time: 12/25/22  1543     History  Chief Complaint  Patient presents with   Fatigue    Hannah Allen is a 57 y.o. female.  57 year old female presents today for evaluation of gastroenteritis type symptoms for a week.  She states she had lower abdominal pain during the start however this is since resolved.  Reports multiple episodes of vomiting and diarrhea per day.  Denies hematemesis or blood in stool.  Unable to keep any food or drink down.  The history is provided by the patient. No language interpreter was used.       Home Medications Prior to Admission medications   Medication Sig Start Date End Date Taking? Authorizing Provider  amLODipine (NORVASC) 10 MG tablet Take 1 tablet (10 mg total) by mouth daily. 10/19/18   Eustaquio Maize, MD  atorvastatin (LIPITOR) 20 MG tablet Take 20 mg by mouth daily.    [provider]  chlorthalidone (HYGROTON) 25 MG tablet Take 25 mg by mouth daily. 08/01/21   [provider]  HYDROcodone-acetaminophen (NORCO) 5-325 MG tablet Take 1 tablet by mouth every 4 (four) hours as needed for moderate pain. 02/02/22   Swinteck, Aaron Edelman, MD  meloxicam (MOBIC) 15 MG tablet Take 1 tablet (15 mg total) by mouth daily. 02/02/22   Swinteck, Aaron Edelman, MD  methocarbamol (ROBAXIN) 500 MG tablet Take 1 tablet (500 mg total) by mouth every 6 (six) hours as needed for muscle spasms. 02/02/22   Swinteck, Aaron Edelman, MD  ondansetron (ZOFRAN) 4 MG tablet Take 1 tablet (4 mg total) by mouth every 8 (eight) hours as needed for nausea or vomiting. 02/02/22   Swinteck, Aaron Edelman, MD  pantoprazole (PROTONIX) 40 MG tablet Take 40 mg by mouth daily. 11/10/21   [provider]  polyethylene glycol (MIRALAX) 17 g packet Take 17 g by mouth daily as needed for moderate constipation or severe constipation. 02/02/22   Swinteck, Aaron Edelman, MD      Allergies    Lisinopril     Review of Systems   Review of Systems  Constitutional:  Negative for chills and fever.  Gastrointestinal:  Positive for diarrhea, nausea and vomiting. Negative for abdominal pain and blood in stool.  Neurological:  Negative for light-headedness.  All other systems reviewed and are negative.   Physical Exam Updated Vital Signs BP 102/69   Pulse 81   Temp 97.9 F (36.6 C) (Oral)   Resp (!) 22   Ht 5' 3.5" (1.613 m)   Wt 105.7 kg   SpO2 100%   BMI 40.63 kg/m  Physical Exam Vitals and nursing note reviewed.  Constitutional:      General: She is not in acute distress.    Appearance: Normal appearance. She is not ill-appearing.     Comments: Uncomfortable appearing  HENT:     Head: Normocephalic and atraumatic.     Nose: Nose normal.  Eyes:     General: No scleral icterus.    Extraocular Movements: Extraocular movements intact.     Conjunctiva/sclera: Conjunctivae normal.  Cardiovascular:     Rate and Rhythm: Normal rate and regular rhythm.     Pulses: Normal pulses.  Pulmonary:     Effort: Pulmonary effort is normal. No respiratory distress.     Breath sounds: Normal breath sounds. No wheezing or rales.  Abdominal:     General: There is no distension.     Palpations:  Abdomen is soft.     Tenderness: There is no abdominal tenderness. There is no guarding.  Musculoskeletal:        General: Normal range of motion.     Cervical back: Normal range of motion.  Skin:    General: Skin is warm and dry.  Neurological:     General: No focal deficit present.     Mental Status: She is alert. Mental status is at baseline.     ED Results / Procedures / Treatments   Labs (all labs ordered are listed, but only abnormal results are displayed) Labs Reviewed  LIPASE, BLOOD - Abnormal; Notable for the following components:      Result Value   Lipase 255 (*)    All other components within normal limits  COMPREHENSIVE METABOLIC PANEL - Abnormal; Notable for the following  components:   Sodium 127 (*)    Potassium 2.1 (*)    Chloride 80 (*)    Glucose, Bld 153 (*)    BUN 49 (*)    Creatinine, Ser 2.09 (*)    GFR, Estimated 27 (*)    Anion gap 20 (*)    All other components within normal limits  CBC - Abnormal; Notable for the following components:   WBC 14.4 (*)    RBC 5.65 (*)    Hemoglobin 15.3 (*)    MCV 77.5 (*)    Platelets 427 (*)    All other components within normal limits  MAGNESIUM  URINALYSIS, ROUTINE W REFLEX MICROSCOPIC  POC URINE PREG, ED    EKG None  Radiology CT ABDOMEN PELVIS WO CONTRAST  Result Date: 12/25/2022 CLINICAL DATA:  Generalized weakness with nausea, vomiting and diarrhea. EXAM: CT ABDOMEN AND PELVIS WITHOUT CONTRAST TECHNIQUE: Multidetector CT imaging of the abdomen and pelvis was performed following the standard protocol without IV contrast. RADIATION DOSE REDUCTION: This exam was performed according to the departmental dose-optimization program which includes automated exposure control, adjustment of the mA and/or kV according to patient size and/or use of iterative reconstruction technique. COMPARISON:  None Available. FINDINGS: Lower chest: No acute abnormality. Hepatobiliary: No focal liver abnormality is seen. Status post cholecystectomy. No biliary dilatation. Pancreas: Unremarkable. No pancreatic ductal dilatation or surrounding inflammatory changes. Spleen: Normal in size without focal abnormality. Adrenals/Urinary Tract: Adrenal glands are unremarkable. Kidneys are normal in size, without renal or hydronephrosis. A 1.9 cm right-sided parapelvic renal cyst is seen (approximately 6.66 Hounsfield units). The urinary bladder is poorly visualized secondary to overlying streak artifact. Stomach/Bowel: Stomach is within normal limits. The appendix is surgically absent. No evidence of bowel wall thickening, distention, or inflammatory changes. Vascular/Lymphatic: Aortic atherosclerosis. Numerous subcentimeter mesenteric lymph  nodes are seen within the mid right abdomen, right lower quadrant and right hemipelvis (the largest measures 9 mm). Numerous tiny soft tissue nodules are seen scattered throughout pericolonic portion of the mesentery. Reproductive: The expected regions of the uterus and bilateral adnexa are limited in evaluation secondary to overlying streak artifact. Other: No abdominal wall hernia or abnormality. No abdominopelvic ascites. Musculoskeletal: Bilateral hip replacements are seen. There is an extensive amount of associated streak artifact with subsequently limited evaluation of the adjacent osseous and soft tissue structures. Marked severity degenerative changes are seen at the level of L5-S1. IMPRESSION: 1. Numerous small mesenteric lymph nodes and tiny soft tissue nodules scattered throughout the pericolonic portion of the mesentery. While these may be reactive in nature, the presence of a neoplastic process cannot be excluded. Correlation with follow-up abdomen and  pelvis CT is recommended. 2. Bilateral hip replacements. 3. Parapelvic right renal cyst. No follow-up imaging is recommended. This recommendation follows ACR consensus guidelines: Management of the Incidental Renal Mass on CT: A White Paper of the ACR Incidental Findings Committee. J Am Coll Radiol [phone]. 4. Marked severity degenerative changes at the level of L5-S1. 5. Aortic atherosclerosis. Aortic Atherosclerosis (ICD10-I70.0). Electronically Signed   By: Virgina Norfolk M.D.   On: 12/25/2022 19:58    Procedures .Critical Care  Performed by: Evlyn Courier, PA-C Authorized by: Evlyn Courier, PA-C   Critical care provider statement:    Critical care time (minutes):  37   Critical care was necessary to treat or prevent imminent or life-threatening deterioration of the following conditions: Potassium of 2.1.   Critical care was time spent personally by me on the following activities:  Development of treatment plan with patient or surrogate,  discussions with consultants, evaluation of patient's response to treatment, examination of patient, ordering and review of laboratory studies, ordering and review of radiographic studies, ordering and performing treatments and interventions, pulse oximetry, re-evaluation of patient's condition and review of old charts   Care discussed with: admitting provider       Medications Ordered in ED Medications  potassium chloride 10 mEq in 100 mL IVPB (10 mEq Intravenous New Bag/Given 12/25/22 2017)  potassium chloride SA (KLOR-CON M) CR tablet 40 mEq (has no administration in time range)  sodium chloride 0.9 % bolus 2,000 mL (2,000 mLs Intravenous New Bag/Given 12/25/22 2018)  ondansetron (ZOFRAN) injection 4 mg (4 mg Intravenous Given 12/25/22 2013)    ED Course/ Medical Decision Making/ A&P                             Medical Decision Making Amount and/or Complexity of Data Reviewed Labs: ordered. Radiology: ordered.  Risk Prescription drug management. Decision regarding hospitalization.   Medical Decision Making / ED Course   This patient presents to the ED for concern of nausea, vomiting, and diarrhea, this involves an extensive number of treatment options, and is a complaint that carries with it a high risk of complications and morbidity.  The differential diagnosis includes gastroenteritis, diverticulitis, appendicitis, pancreatitis  MDM: 57 year old female presents today for evaluation of gastroenteritis type symptoms with potassium of 2.1.  Symptoms ongoing for about a week.  Unable to keep any food or drink down.  CBC with leukocytosis of 14.4 which is likely reactive.  No anemia.  Afebrile.  CMP again shows potassium of 2.1, sodium 127, new AKI with creatinine of 2.09 elevated BUN.  40 mEq of potassium ordered via IV, additional 40 ordered p.o.  2 g of magnesium ordered.  Fluid bolus ordered for dehydration.  Discussed with hospitalist will evaluate patient for admission.   Lab  Tests: -I ordered, reviewed, and interpreted labs.   The pertinent results include:   Labs Reviewed  LIPASE, BLOOD - Abnormal; Notable for the following components:      Result Value   Lipase 255 (*)    All other components within normal limits  COMPREHENSIVE METABOLIC PANEL - Abnormal; Notable for the following components:   Sodium 127 (*)    Potassium 2.1 (*)    Chloride 80 (*)    Glucose, Bld 153 (*)    BUN 49 (*)    Creatinine, Ser 2.09 (*)    GFR, Estimated 27 (*)    Anion gap 20 (*)    All other  components within normal limits  CBC - Abnormal; Notable for the following components:   WBC 14.4 (*)    RBC 5.65 (*)    Hemoglobin 15.3 (*)    MCV 77.5 (*)    Platelets 427 (*)    All other components within normal limits  MAGNESIUM  URINALYSIS, ROUTINE W REFLEX MICROSCOPIC  POC URINE PREG, ED      EKG  EKG Interpretation  Date/Time:    Ventricular Rate:    PR Interval:    QRS Duration:   QT Interval:    QTC Calculation:   R Axis:     Text Interpretation:           Imaging Studies ordered: I ordered imaging studies including CT abdomen pelvis without contrast.  No acute findings. I independently visualized and interpreted imaging. I agree with the radiologist interpretation   Medicines ordered and prescription drug management: Meds ordered this encounter  Medications   potassium chloride 10 mEq in 100 mL IVPB   potassium chloride SA (KLOR-CON M) CR tablet 40 mEq   DISCONTD: lactated ringers bolus 2,000 mL   DISCONTD: magnesium sulfate IVPB 2 g 50 mL   sodium chloride 0.9 % bolus 2,000 mL   ondansetron (ZOFRAN) injection 4 mg    -I have reviewed the patients home medicines and have made adjustments as needed  Critical interventions IV and p.o. potassium repletion, IV fluid hydration, IV magnesium  Reevaluation: After the interventions noted above, I reevaluated the patient and found that they have :stayed the same  Co morbidities that complicate  the patient evaluation  Past Medical History:  Diagnosis Date   Allergy    Anxiety    Arthritis    COVID    GERD (gastroesophageal reflux disease)    Headache    Hyperlipidemia    Hypertension    Hypothyroidism    Pre-diabetes    Varicose veins       Dispostion: Patient discussed with hospitalist will evaluate patient for admission.  Final Clinical Impression(s) / ED Diagnoses Final diagnoses:  Hypokalemia  Dehydration  Gastroenteritis    Rx / DC Orders ED Discharge Orders     None         Evlyn Courier, PA-C 12/25/22 2114    Audley Hose, MD 12/26/22 [phone]

## 2022-12-25 NOTE — ED Notes (Signed)
Patient transported to CT via wheelchair

## 2022-12-25 NOTE — H&P (Signed)
History and Physical    Dawan Betro [id number] DOB: 1966/02/03 DOA: 12/25/2022  PCP: [name], FNP   Patient coming from: Home   Chief Complaint: Fatigue, general weakness, N/V/D   HPI: Hannah Allen is a 57 y.o. female with medical history significant for hypertension, hyperlipidemia, and GERD who presents to the emergency department with fatigue, generalized weakness, nausea, vomiting, and diarrhea.  Patient reports that she had COVID roughly 3 weeks ago, seem to improve from that but then developed abdominal pain with nausea, vomiting, and diarrhea on 12/17/2022.  She was seen the following day in the emergency department where she was given IV fluids and electrolyte replacement and discharged home.  Since then, the vomiting and diarrhea has slowed significantly but persists.  Her fatigue and generalized weakness have worsened.  ED Course: Upon arrival to the ED, patient is found to be afebrile and saturating well on room air with stable blood pressure.  EKG demonstrates sinus rhythm with QTc 563.  CT of the abdomen pelvis is notable for numerous small mesenteric lymph nodes and tiny soft tissue nodules, unremarkable pancreas, and no hydronephrosis.  Labs were most notable for sodium 127, potassium 2.1, BUN 49, creatinine 2.09, WBC 14,400, and platelets 427,000.  She was given 2 L of normal saline, oral and IV potassium, and IV magnesium in the ED.  Review of Systems:  All other systems reviewed and apart from HPI, are negative.  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 Her smoking use included cigarettes. She has never used smokeless tobacco. She reports that she does not drink alcohol and does not use drugs.  Allergies  Allergen Reactions   Lisinopril     ?angioedema    Family History  Problem Relation Age of Onset   Heart disease Maternal Grandmother    Diabetes Maternal Grandmother      Prior to Admission medications   Medication Sig Start Date End Date Taking? Authorizing Provider  amLODipine (NORVASC) 10 MG tablet Take 1 tablet (10 mg total) by mouth daily. 10/19/18   Eustaquio Maize, MD  atorvastatin (LIPITOR) 20 MG tablet Take 20 mg by mouth daily.    [provider]  chlorthalidone (HYGROTON) 25 MG tablet Take 25 mg by mouth daily. 08/01/21   [provider]  HYDROcodone-acetaminophen (NORCO) 5-325 MG tablet Take 1 tablet by mouth every 4 (four) hours as needed for moderate pain. 02/02/22   Swinteck, Aaron Edelman, MD  meloxicam (MOBIC) 15 MG tablet Take 1 tablet (15 mg total) by mouth daily. 02/02/22   Swinteck, Aaron Edelman, MD  methocarbamol (ROBAXIN) 500 MG tablet Take 1 tablet (500 mg total)  by mouth every 6 (six) hours as needed for muscle spasms. 02/02/22   Swinteck, Aaron Edelman, MD  ondansetron (ZOFRAN) 4 MG tablet Take 1 tablet (4 mg total) by mouth every 8 (eight) hours as needed for nausea or vomiting. 02/02/22   Swinteck, Aaron Edelman, MD  pantoprazole (PROTONIX) 40 MG tablet Take 40 mg by mouth daily. 11/10/21   [provider]  polyethylene glycol (MIRALAX) 17 g packet Take 17 g by mouth daily as needed for moderate constipation or severe constipation. 02/02/22   Rod Can, MD    Physical Exam: Vitals:    12/25/22 2005 12/25/22 2030 12/25/22 2145 12/25/22 2200  BP: 102/69 104/65 112/68 112/72  Pulse: 81 84 61 62  Resp: (!) '22 16 10 13  '$ Temp:      TempSrc:      SpO2: 100% 100% 97% 100%  Weight:      Height:         Constitutional: NAD, no pallor or diaphoresis   Eyes: PERTLA, lids and conjunctivae normal ENMT: Mucous membranes are moist. Posterior pharynx clear of any exudate or lesions.   Neck: supple, no masses  Respiratory: no wheezing, no crackles. No accessory muscle use.  Cardiovascular: S1 & S2 heard, regular rate and rhythm. No extremity edema.   Abdomen: No distension, no tenderness, soft. Bowel sounds active.  Musculoskeletal: no clubbing / cyanosis. No joint deformity upper and lower extremities.   Skin: no significant rashes, lesions, ulcers. Warm, dry, well-perfused. Neurologic: CN 2-12 grossly intact. Moving all extremities. Alert and oriented.  Psychiatric: Calm. Cooperative.    Labs and Imaging on Admission: I have personally reviewed following labs and imaging studies  CBC: Recent Labs  Lab 12/25/22 1740  WBC 14.4*  HGB 15.3*  HCT 43.8  MCV 77.5*  PLT [id number]*   Basic Metabolic Panel: Recent Labs  Lab 12/25/22 1740  NA 127*  K 2.1*  CL 80*  CO2 27  GLUCOSE 153*  BUN 49*  CREATININE 2.09*  CALCIUM 9.2  MG 2.4   GFR: Estimated Creatinine Clearance: 34.9 mL/min (A) (by C-G formula based on SCr of 2.09 mg/dL (H)). Liver Function Tests: Recent Labs  Lab 12/25/22 1740  AST 33  ALT 31  ALKPHOS 74  BILITOT 0.8  PROT 7.9  ALBUMIN 3.7   Recent Labs  Lab 12/25/22 1740  LIPASE 255*   No results for input(s): "AMMONIA" in the last 168 hours. Coagulation Profile: No results for input(s): "INR", "PROTIME" in the last 168 hours. Cardiac Enzymes: No results for input(s): "CKTOTAL", "CKMB", "CKMBINDEX", "TROPONINI" in the last 168 hours. BNP (last 3 results) No results for input(s): "PROBNP" in the last 8760 hours. HbA1C: No results for input(s):  "HGBA1C" in the last 72 hours. CBG: No results for input(s): "GLUCAP" in the last 168 hours. Lipid Profile: No results for input(s): "CHOL", "HDL", "LDLCALC", "TRIG", "CHOLHDL", "LDLDIRECT" in the last 72 hours. Thyroid Function Tests: No results for input(s): "TSH", "T4TOTAL", "FREET4", "T3FREE", "THYROIDAB" in the last 72 hours. Anemia Panel: No results for input(s): "VITAMINB12", "FOLATE", "FERRITIN", "TIBC", "IRON", "RETICCTPCT" in the last 72 hours. Urine analysis: No results found for: "COLORURINE", "APPEARANCEUR", "LABSPEC", "PHURINE", "GLUCOSEU", "HGBUR", "BILIRUBINUR", "KETONESUR", "PROTEINUR", "UROBILINOGEN", "NITRITE", "LEUKOCYTESUR" Sepsis Labs: '@LABRCNTIP'$ (procalcitonin:4,lacticidven:4) )No results found for this or any previous visit (from the past 240 hour(s)).   Radiological Exams on Admission: CT ABDOMEN PELVIS WO CONTRAST  Result Date: 12/25/2022 CLINICAL DATA:  Generalized weakness with nausea, vomiting and diarrhea. EXAM: CT ABDOMEN AND PELVIS WITHOUT CONTRAST TECHNIQUE: Multidetector  CT imaging of the abdomen and pelvis was performed following the standard protocol without IV contrast. RADIATION DOSE REDUCTION: This exam was performed according to the departmental dose-optimization program which includes automated exposure control, adjustment of the mA and/or kV according to patient size and/or use of iterative reconstruction technique. COMPARISON:  None Available. FINDINGS: Lower chest: No acute abnormality. Hepatobiliary: No focal liver abnormality is seen. Status post cholecystectomy. No biliary dilatation. Pancreas: Unremarkable. No pancreatic ductal dilatation or surrounding inflammatory changes. Spleen: Normal in size without focal abnormality. Adrenals/Urinary Tract: Adrenal glands are unremarkable. Kidneys are normal in size, without renal or hydronephrosis. A 1.9 cm right-sided parapelvic renal cyst is seen (approximately 6.66 Hounsfield units). The urinary bladder is  poorly visualized secondary to overlying streak artifact. Stomach/Bowel: Stomach is within normal limits. The appendix is surgically absent. No evidence of bowel wall thickening, distention, or inflammatory changes. Vascular/Lymphatic: Aortic atherosclerosis. Numerous subcentimeter mesenteric lymph nodes are seen within the mid right abdomen, right lower quadrant and right hemipelvis (the largest measures 9 mm). Numerous tiny soft tissue nodules are seen scattered throughout pericolonic portion of the mesentery. Reproductive: The expected regions of the uterus and bilateral adnexa are limited in evaluation secondary to overlying streak artifact. Other: No abdominal wall hernia or abnormality. No abdominopelvic ascites. Musculoskeletal: Bilateral hip replacements are seen. There is an extensive amount of associated streak artifact with subsequently limited evaluation of the adjacent osseous and soft tissue structures. Marked severity degenerative changes are seen at the level of L5-S1. IMPRESSION: 1. Numerous small mesenteric lymph nodes and tiny soft tissue nodules scattered throughout the pericolonic portion of the mesentery. While these may be reactive in nature, the presence of a neoplastic process cannot be excluded. Correlation with follow-up abdomen and pelvis CT is recommended. 2. Bilateral hip replacements. 3. Parapelvic right renal cyst. No follow-up imaging is recommended. This recommendation follows ACR consensus guidelines: Management of the Incidental Renal Mass on CT: A White Paper of the ACR Incidental Findings Committee. J Am Coll Radiol [phone]. 4. Marked severity degenerative changes at the level of L5-S1. 5. Aortic atherosclerosis. Aortic Atherosclerosis (ICD10-I70.0). Electronically Signed   By: Virgina Norfolk M.D.   On: 12/25/2022 19:58    EKG: Independently reviewed. Sinus rhythm, QTc 563.   Assessment/Plan   1. AKI  - No hydronephrosis on CT in ED - Likely prerenal in  setting of N/V/D  - Given 2 liters IVF in ED  - Continue IVF hydration, renally-dose medications, hold chlorthalidone, repeat chem panel in am   2. Hypokalemia  - Serum potassium is 2.1 in setting of GI-losses  - Replacing orally and intravenously, repeating chem panel in am    3. Hyponatremia  - Serum sodium 127 in setting of hypovolemia  - Continue isotonic IVF hydration and repeat chem panel in am   4. N/V/D  - Exam benign and no acute findings on CT  - N/V/D has all been improving, was likely viral GE  - Continue IVF hydration and electrolyte correction, start with clear liquid diet and advance as tolerated, check C diff and GI pathogen panel if diarrhea persists    5. Prolonged QT interval  - Correct hypokalemia, avoid QT-prolonging medications    6. Mesenteric lymphadenopathy  - Noted on CT in ED  - Discussed with patient, outpatient follow-up with repeat CT recommended    DVT prophylaxis: sq heparin  Code Status: Full  Level of Care: Level of care: Telemetry Family Communication: None present  Disposition Plan:  Patient is  from: Home  Anticipated d/c is to: TBD Anticipated d/c date is: 12/28/22  Patient currently: Pending improved/stable renal function and tolerance of adequate oral intake  Consults called: None  Admission status: Inpatient     Vianne Bulls, MD [facility]  12/25/2022, 10:52 PM

## 2022-12-25 NOTE — ED Provider Notes (Signed)
57 y.o. female p/w nausea/vomiting/diarrhea x 1 week. Initially had lower abdominal pain that radiated upward. No f/c. Labs demonstrate leukocytosis 14, K 2.1, Na 127, Cr 2.09 up from 0.75, Hgb 15.3,

## 2022-12-26 ENCOUNTER — Other Ambulatory Visit: Payer: Self-pay

## 2022-12-26 ENCOUNTER — Encounter (HOSPITAL_COMMUNITY): Payer: Self-pay | Admitting: Family Medicine

## 2022-12-26 DIAGNOSIS — N179 Acute kidney failure, unspecified: Secondary | ICD-10-CM | POA: Diagnosis not present

## 2022-12-26 LAB — BASIC METABOLIC PANEL
Anion gap: 14 (ref 5–15)
BUN: 41 mg/dL — ABNORMAL HIGH (ref 6–20)
CO2: 25 mmol/L (ref 22–32)
Calcium: 7.9 mg/dL — ABNORMAL LOW (ref 8.9–10.3)
Chloride: 90 mmol/L — ABNORMAL LOW (ref 98–111)
Creatinine, Ser: 1.25 mg/dL — ABNORMAL HIGH (ref 0.44–1.00)
GFR, Estimated: 50 mL/min — ABNORMAL LOW (ref >60)
Glucose, Bld: 112 mg/dL — ABNORMAL HIGH (ref 70–99)
Potassium: 2.2 mmol/L — CL (ref 3.5–5.1)
Sodium: 129 mmol/L — ABNORMAL LOW (ref 135–145)

## 2022-12-26 LAB — HIV ANTIBODY (ROUTINE TESTING W REFLEX): HIV Screen 4th Generation wRfx: NONREACTIVE

## 2022-12-26 LAB — C DIFFICILE QUICK SCREEN W PCR REFLEX
C Diff antigen: NEGATIVE
C Diff interpretation: NOT DETECTED
C Diff toxin: NEGATIVE

## 2022-12-26 LAB — CBC
HCT: 38.4 % (ref 36.0–46.0)
Hemoglobin: 13.5 g/dL (ref 12.0–15.0)
MCH: 27.7 pg (ref 26.0–34.0)
MCHC: 35.2 g/dL (ref 30.0–36.0)
MCV: 78.7 fL — ABNORMAL LOW (ref 80.0–100.0)
Platelets: 316 10*3/uL (ref 150–400)
RBC: 4.88 MIL/uL (ref 3.87–5.11)
RDW: 13.9 % (ref 11.5–15.5)
WBC: 10.4 10*3/uL (ref 4.0–10.5)
nRBC: 0 % (ref 0.0–0.2)

## 2022-12-26 MED ORDER — METOCLOPRAMIDE HCL 5 MG/ML IJ SOLN
5.0000 mg | Freq: Three times a day (TID) | INTRAMUSCULAR | Status: DC | PRN
  Administered 2022-12-26: 5 mg via INTRAVENOUS
  Filled 2022-12-26: qty 2

## 2022-12-26 MED ORDER — METOCLOPRAMIDE HCL 5 MG/ML IJ SOLN
10.0000 mg | Freq: Once | INTRAMUSCULAR | Status: AC
  Administered 2022-12-26: 10 mg via INTRAVENOUS
  Filled 2022-12-26: qty 2

## 2022-12-26 MED ORDER — LOPERAMIDE HCL 2 MG PO CAPS
2.0000 mg | ORAL_CAPSULE | Freq: Four times a day (QID) | ORAL | Status: DC | PRN
  Administered 2022-12-27 – 2022-12-29 (×3): 2 mg via ORAL
  Filled 2022-12-26 (×3): qty 1

## 2022-12-26 MED ORDER — POTASSIUM CHLORIDE CRYS ER 20 MEQ PO TBCR
40.0000 meq | EXTENDED_RELEASE_TABLET | ORAL | Status: AC
  Administered 2022-12-26: 40 meq via ORAL
  Filled 2022-12-26: qty 2

## 2022-12-26 MED ORDER — POTASSIUM CHLORIDE IN NACL 20-0.9 MEQ/L-% IV SOLN: INTRAVENOUS | Status: AC

## 2022-12-26 MED ORDER — LOPERAMIDE HCL 2 MG PO CAPS
4.0000 mg | ORAL_CAPSULE | Freq: Once | ORAL | Status: AC
  Administered 2022-12-26: 4 mg via ORAL
  Filled 2022-12-26: qty 2

## 2022-12-26 NOTE — Progress Notes (Signed)
PROGRESS NOTE     Hannah Allen, is a 57 y.o. female, DOB - 11-20-1965, [id number]  Admit date - 12/25/2022   Admitting Physician Vianne Bulls, MD  Outpatient Primary MD for the patient is [name], FNP  LOS - 1  Chief Complaint  Patient presents with   Fatigue       Brief Narrative:  57 y.o. female with medical history significant for hypertension, hyperlipidemia, and GERD who presents to the emergency department with fatigue, generalized weakness, nausea, vomiting, and diarrhea admitted on 12/25/2022 with AKI and severe hypokalemia secondary to GI losses    -Assessment and Plan: 1)AKI----acute kidney injury ---due to volume depletion/dehydration in the setting of vomiting and diarrhea -Creatinine on admission 2.09 baseline 0.6-0.8 -Renal function improving with IV fluids  , renally adjust medications, avoid nephrotoxic agents / dehydration  / hypotension  2) severe hypokalemia--- potassium initially was 2.1 after replacement only up to 2.2 -Magnesium WNL -due to GI losses -Replace and recheck  3) nausea vomiting and diarrhea/elevated lipase --- stool for C. difficile negative, GI pathogen/stool culture pending -Lipase is 255---patient denies EtOH use, LFTs are not elevated -Pancreas is unremarkable on CT abdomen and pelvis -CT abdomen pelvis with nonspecific lymphadenopathy most likely reactive--repeat CT abdomen and pelvis in 3 to 6 months as outpatient as advised -As needed antiemetics  4) hyponatremia--sodium was down to 127 due to dehydration as noted above -Continue IV fluids  5) leukocytosis--- WBC is down to 10.4 from 14.4 suspect leukocytosis is reactive in the setting of persistent emesis  Status is: Inpatient   Disposition: The patient is from: Home              Anticipated d/c is to: Home              Anticipated d/c date is: 1 day              Patient currently is not medically stable to d/c. Barriers: Not Clinically Stable-   Code Status :   -  Code Status: Full Code   Family Communication:    NA (patient is alert, awake and coherent)   DVT Prophylaxis  :   - SCDs   heparin injection 5,000 Units Start: 12/25/22 2200   Lab Results  Component Value Date   PLT 316 12/26/2022    Inpatient Medications  Scheduled Meds:  atorvastatin  20 mg Oral Daily   heparin  5,000 Units Subcutaneous Q8H   pantoprazole  40 mg Oral Daily   potassium chloride SA  40 mEq Oral Once   potassium chloride  40 mEq Oral Q3H   sodium chloride flush  3 mL Intravenous Q12H   Continuous Infusions:  0.9 % NaCl with KCl 20 mEq / L 100 mL/hr at 12/26/22 1759   PRN Meds:.acetaminophen **OR** acetaminophen, loperamide, metoCLOPramide (REGLAN) injection   Anti-infectives (From admission, onward)    None         Subjective: Hannah Allen today has no fevers,  No chest pain,    Nausea persist, no emesis -Diarrhea improved with Imodium  Objective: Vitals:   12/26/22 0827 12/26/22 0849 12/26/22 1230 12/26/22 1632  BP: (!) 95/56  103/60 95/60  Pulse:   68 71  Resp: '18  18 18  '$ Temp: 98.1 F (36.7 C)  98.3 F (36.8 C) 98.9 F (37.2 C)  TempSrc: Oral  Oral Oral  SpO2: 99%  99% 100%  Weight:      Height:  '5\' 2"'$  (1.575  m)      Intake/Output Summary (Last 24 hours) at 12/26/2022 1846 Last data filed at 12/26/2022 1755 Gross per 24 hour  Intake 3000 ml  Output --  Net 3000 ml   Filed Weights   12/25/22 1710 12/26/22 0821  Weight: 105.7 kg 102.4 kg   Physical Exam Gen:- Awake Alert, in no acute distress HEENT:- [place].AT, No sclera icterus Neck-Supple Neck,No JVD,.  Lungs-  CTAB , fair symmetrical air movement CV- S1, S2 normal, regular  Abd-  +ve B.Sounds, Abd Soft, epigastric tenderness, without rebound or guarding, increased truncal adiposity Extremity/Skin:- No  edema, pedal pulses present  Psych-affect is appropriate, oriented x3 Neuro-no new focal deficits, no tremors  Data Reviewed: I have personally reviewed following labs  and imaging studies  CBC: Recent Labs  Lab 12/25/22 1740 12/26/22 1050  WBC 14.4* 10.4  HGB 15.3* 13.5  HCT 43.8 38.4  MCV 77.5* 78.7*  PLT 427* [id number]   Basic Metabolic Panel: Recent Labs  Lab 12/25/22 1740 12/26/22 0949  NA 127* 129*  K 2.1* 2.2*  CL 80* 90*  CO2 27 25  GLUCOSE 153* 112*  BUN 49* 41*  CREATININE 2.09* 1.25*  CALCIUM 9.2 7.9*  MG 2.4  --    GFR: Estimated Creatinine Clearance: 55.7 mL/min (A) (by C-G formula based on SCr of 1.25 mg/dL (H)). Liver Function Tests: Recent Labs  Lab 12/25/22 1740  AST 33  ALT 31  ALKPHOS 74  BILITOT 0.8  PROT 7.9  ALBUMIN 3.7   Recent Results (from the past 240 hour(s))  C Difficile Quick Screen w PCR reflex     Status: None   Collection Time: 12/25/22 11:01 PM   Specimen: STOOL  Result Value Ref Range Status   C Diff antigen NEGATIVE NEGATIVE Final   C Diff toxin NEGATIVE NEGATIVE Final   C Diff interpretation No C. difficile detected.  Final    Comment: Performed at [facility], [address] [place], [place] [address]     Radiology Studies: CT ABDOMEN PELVIS WO CONTRAST  Result Date: 12/25/2022 CLINICAL DATA:  Generalized weakness with nausea, vomiting and diarrhea. EXAM: CT ABDOMEN AND PELVIS WITHOUT CONTRAST TECHNIQUE: Multidetector CT imaging of the abdomen and pelvis was performed following the standard protocol without IV contrast. RADIATION DOSE REDUCTION: This exam was performed according to the departmental dose-optimization program which includes automated exposure control, adjustment of the mA and/or kV according to patient size and/or use of iterative reconstruction technique. COMPARISON:  None Available. FINDINGS: Lower chest: No acute abnormality. Hepatobiliary: No focal liver abnormality is seen. Status post cholecystectomy. No biliary dilatation. Pancreas: Unremarkable. No pancreatic ductal dilatation or surrounding inflammatory changes. Spleen: Normal in size without focal abnormality.  Adrenals/Urinary Tract: Adrenal glands are unremarkable. Kidneys are normal in size, without renal or hydronephrosis. A 1.9 cm right-sided parapelvic renal cyst is seen (approximately 6.66 Hounsfield units). The urinary bladder is poorly visualized secondary to overlying streak artifact. Stomach/Bowel: Stomach is within normal limits. The appendix is surgically absent. No evidence of bowel wall thickening, distention, or inflammatory changes. Vascular/Lymphatic: Aortic atherosclerosis. Numerous subcentimeter mesenteric lymph nodes are seen within the mid right abdomen, right lower quadrant and right hemipelvis (the largest measures 9 mm). Numerous tiny soft tissue nodules are seen scattered throughout pericolonic portion of the mesentery. Reproductive: The expected regions of the uterus and bilateral adnexa are limited in evaluation secondary to overlying streak artifact. Other: No abdominal wall hernia or abnormality. No abdominopelvic ascites. Musculoskeletal: Bilateral hip replacements are  seen. There is an extensive amount of associated streak artifact with subsequently limited evaluation of the adjacent osseous and soft tissue structures. Marked severity degenerative changes are seen at the level of L5-S1. IMPRESSION: 1. Numerous small mesenteric lymph nodes and tiny soft tissue nodules scattered throughout the pericolonic portion of the mesentery. While these may be reactive in nature, the presence of a neoplastic process cannot be excluded. Correlation with follow-up abdomen and pelvis CT is recommended. 2. Bilateral hip replacements. 3. Parapelvic right renal cyst. No follow-up imaging is recommended. This recommendation follows ACR consensus guidelines: Management of the Incidental Renal Mass on CT: A White Paper of the ACR Incidental Findings Committee. J Am Coll Radiol [phone]. 4. Marked severity degenerative changes at the level of L5-S1. 5. Aortic atherosclerosis. Aortic Atherosclerosis  (ICD10-I70.0). Electronically Signed   By: Virgina Norfolk M.D.   On: 12/25/2022 19:58    Scheduled Meds:  atorvastatin  20 mg Oral Daily   heparin  5,000 Units Subcutaneous Q8H   pantoprazole  40 mg Oral Daily   potassium chloride SA  40 mEq Oral Once   potassium chloride  40 mEq Oral Q3H   sodium chloride flush  3 mL Intravenous Q12H   Continuous Infusions:  0.9 % NaCl with KCl 20 mEq / L 100 mL/hr at 12/26/22 1759    LOS: 1 day   Roxan Hockey M.D on 12/26/2022 at 6:46 PM  Go to [email or web address] - for contact info  [facility] - Office  [phone]  If 7PM-7AM, please contact night-coverage [email or web address] 12/26/2022, 6:46 PM

## 2022-12-26 NOTE — TOC Progression Note (Signed)
  Transition of Care [facility]) Screening Note   Patient Details  Name: Hannah Allen Date of Birth: 02-17-1966   Transition of Care [facility]) CM/SW Contact:    Shade Flood, LCSW Phone Number: 12/26/2022, 9:58 AM    Transition of Care Department [facility]) has reviewed patient and no TOC needs have been identified at this time. We will continue to monitor patient advancement through interdisciplinary progression rounds. If new patient transition needs arise, please place a TOC consult.

## 2022-12-26 NOTE — Progress Notes (Signed)
Pt states has been menopausal since she was 57 years old, does not feel need for urine pregnancy test.

## 2022-12-27 DIAGNOSIS — N179 Acute kidney failure, unspecified: Secondary | ICD-10-CM | POA: Diagnosis not present

## 2022-12-27 LAB — GASTROINTESTINAL PANEL BY PCR, STOOL (REPLACES STOOL CULTURE)

## 2022-12-27 LAB — RENAL FUNCTION PANEL
Albumin: 2.8 g/dL — ABNORMAL LOW (ref 3.5–5.0)
Anion gap: 8 (ref 5–15)
BUN: 26 mg/dL — ABNORMAL HIGH (ref 6–20)
CO2: 27 mmol/L (ref 22–32)
Calcium: 7.9 mg/dL — ABNORMAL LOW (ref 8.9–10.3)
Chloride: 94 mmol/L — ABNORMAL LOW (ref 98–111)
Creatinine, Ser: 1.05 mg/dL — ABNORMAL HIGH (ref 0.44–1.00)
GFR, Estimated: >60 mL/min (ref >60)
Glucose, Bld: 115 mg/dL — ABNORMAL HIGH (ref 70–99)
Phosphorus: 1.9 mg/dL — ABNORMAL LOW (ref 2.5–4.6)
Potassium: 2.2 mmol/L — CL (ref 3.5–5.1)
Sodium: 129 mmol/L — ABNORMAL LOW (ref 135–145)

## 2022-12-27 MED ORDER — POTASSIUM CHLORIDE IN NACL 40-0.9 MEQ/L-% IV SOLN
INTRAVENOUS | Status: AC
  Administered 2022-12-27: 100 mL/h via INTRAVENOUS

## 2022-12-27 MED ORDER — POTASSIUM CHLORIDE CRYS ER 20 MEQ PO TBCR: 40.0000 meq | EXTENDED_RELEASE_TABLET | Freq: Three times a day (TID) | ORAL | Status: DC

## 2022-12-27 MED ORDER — SODIUM CHLORIDE 0.9 % IV SOLN
2.0000 g | INTRAVENOUS | Status: DC
  Administered 2022-12-27 – 2022-12-30 (×4): 2 g via INTRAVENOUS
  Filled 2022-12-27 (×4): qty 20

## 2022-12-27 MED ORDER — POTASSIUM CHLORIDE 10 MEQ/100ML IV SOLN
10.0000 meq | INTRAVENOUS | Status: AC
  Administered 2022-12-27 (×2): 10 meq via INTRAVENOUS
  Filled 2022-12-27 (×2): qty 100

## 2022-12-27 MED ORDER — POTASSIUM CHLORIDE CRYS ER 20 MEQ PO TBCR
40.0000 meq | EXTENDED_RELEASE_TABLET | ORAL | Status: AC
  Administered 2022-12-27: 40 meq via ORAL
  Filled 2022-12-27 (×2): qty 2

## 2022-12-27 MED ORDER — POTASSIUM CHLORIDE CRYS ER 20 MEQ PO TBCR
40.0000 meq | EXTENDED_RELEASE_TABLET | ORAL | Status: DC
  Administered 2022-12-27: 40 meq via ORAL

## 2022-12-27 MED ORDER — POTASSIUM CHLORIDE CRYS ER 20 MEQ PO TBCR
40.0000 meq | EXTENDED_RELEASE_TABLET | Freq: Three times a day (TID) | ORAL | Status: DC
  Filled 2022-12-27: qty 2

## 2022-12-27 MED ORDER — POTASSIUM CHLORIDE 10 MEQ/100ML IV SOLN
10.0000 meq | INTRAVENOUS | Status: AC
  Administered 2022-12-27 – 2022-12-28 (×4): 10 meq via INTRAVENOUS
  Filled 2022-12-27 (×4): qty 100

## 2022-12-27 MED ORDER — POTASSIUM PHOSPHATES 15 MMOLE/5ML IV SOLN
30.0000 mmol | Freq: Once | INTRAVENOUS | Status: AC
  Administered 2022-12-27: 30 mmol via INTRAVENOUS
  Filled 2022-12-27: qty 10

## 2022-12-27 MED ORDER — PROCHLORPERAZINE 25 MG RE SUPP
25.0000 mg | Freq: Once | RECTAL | Status: AC
  Administered 2022-12-27: 25 mg via RECTAL
  Filled 2022-12-27: qty 1

## 2022-12-27 NOTE — Progress Notes (Signed)
Provider notified of critical potassium of 2.2, provider notified, new orders received.

## 2022-12-27 NOTE — Progress Notes (Addendum)
PROGRESS NOTE     Hannah Allen, is a 57 y.o. female, DOB - [birth year], [id number]  Admit date - 12/25/2022   Admitting Physician Hannah Bulls, MD  Outpatient Primary MD for the patient is Hannah Jude, FNP  LOS - 2  Chief Complaint  Patient presents with   Fatigue       Brief Narrative:  57 y.o. female with medical history significant for hypertension, hyperlipidemia, and GERD who presents to the emergency department with fatigue, generalized weakness, nausea, vomiting, and diarrhea admitted on 12/25/2022 with AKI and severe hypokalemia secondary to GI losses    -Assessment and Plan: 1)AKI----acute kidney injury ---due to volume depletion/dehydration in the setting of vomiting and diarrhea -Creatinine on admission 2.09  baseline 0.6-0.8 -Renal function improving with IV fluids  -renally adjust medications, avoid nephrotoxic agents / dehydration  / hypotension  2)Severe Persistent hypokalemia/hypophosphatemia --- due to GI losses - potassium initially was 2.1 after replacement only up to 2.2 -Phosphorus is low------ replace -Magnesium WNL -Replace iv and orally and recheck  3) Salmonella enterocolitis--  nausea vomiting and diarrhea/elevated lipase ---  -stool for C. difficile negative,  GI pathogen/stool culture with Salmonella -Lipase is 255---patient denies EtOH use, LFTs are not elevated -Pancreas is unremarkable on CT abdomen and pelvis -CT abdomen pelvis with nonspecific lymphadenopathy most likely reactive--repeat CT abdomen and pelvis in 3 to 6 months as outpatient as advised -As needed antiemetics  4) hyponatremia--sodium was down to 127 due to dehydration in the setting of nausea vomiting and diarrhea As noted above -Continue IV fluids  5) leukocytosis--- WBC is down to 10.4 from 14.4 suspect leukocytosis is reactive in the setting of persistent emesis  Disposition--unable to discharge home at this time as patient continues to have significant nausea  and vomiting, she vomited the potassium pills potassium remains quite low due to ongoing diarrhea and vomiting unable to replace adequately due to persistent emesis--- patient still needs IV fluids and IV potassium replacements at this time----anticipate discharge home in 24 to 48 hours from now if able to tolerate oral intake and electrolytes abnormalities can be replaced orally  Status is: Inpatient   Disposition: The patient is from: Home              Anticipated d/c is to: Home              Anticipated d/c date is: 1 day              Patient currently is not medically stable to d/c. Barriers: Not Clinically Stable-   Code Status :  -  Code Status: Full Code   Family Communication:    NA (patient is alert, awake and coherent)   DVT Prophylaxis  :   - SCDs   heparin injection 5,000 Units Start: 12/25/22 2200   Lab Results  Component Value Date   PLT 316 12/26/2022    Inpatient Medications  Scheduled Meds:  atorvastatin  20 mg Oral Daily   heparin  5,000 Units Subcutaneous Q8H   pantoprazole  40 mg Oral Daily   potassium chloride  40 mEq Oral TID   potassium chloride  40 mEq Oral Q3H   sodium chloride flush  3 mL Intravenous Q12H   Continuous Infusions:  0.9 % NaCl with KCl 20 mEq / L 150 mL/hr at 12/27/22 0937   0.9 % NaCl with KCl 40 mEq / L     cefTRIAXone (ROCEPHIN)  IV Stopped (12/27/22 0530)   potassium  chloride 10 mEq (12/27/22 0835)   PRN Meds:.acetaminophen **OR** acetaminophen, loperamide, metoCLOPramide (REGLAN) injection   Anti-infectives (From admission, onward)    Start     Dose/Rate Route Frequency Ordered Stop   12/27/22 0545  cefTRIAXone (ROCEPHIN) 2 g in sodium chloride 0.9 % 100 mL IVPB        2 g 200 mL/hr over 30 Minutes Intravenous Every 24 hours 12/27/22 0450           Subjective: Hannah Allen today has no fevers,  No chest pain,    --Persistent nausea and vomiting--she vomited potassium tablets  this morning --Diarrhea malaise and  fatigue persist  Objective: Vitals:   12/26/22 1632 12/26/22 2000 12/27/22 0454 12/27/22 0458  BP: 95/60 (!) 105/53 (!) 106/57   Pulse: 71 74 64   Resp: '18 18 18   '$ Temp: 98.9 F (37.2 C) 98.5 F (36.9 C) 98.2 F (36.8 C)   TempSrc: Oral Oral Oral   SpO2: 100% 97% 94%   Weight:    106 kg  Height:    '5\' 2"'$  (1.575 m)    Intake/Output Summary (Last 24 hours) at 12/27/2022 0944 Last data filed at 12/27/2022 0541 Gross per 24 hour  Intake 1963.2 ml  Output --  Net 1963.2 ml   Filed Weights   12/25/22 1710 12/26/22 0821 12/27/22 0458  Weight: 105.7 kg 102.4 kg 106 kg   Physical Exam Gen:- Awake Alert, in no acute distress HEENT:- [place].AT, No sclera icterus Neck-Supple Neck,No JVD,.  Lungs-  CTAB , fair symmetrical air movement CV- S1, S2 normal, regular  Abd-  +ve B.Sounds, Abd Soft, epigastric tenderness, without rebound or guarding, increased truncal adiposity Extremity/Skin:- No  edema, pedal pulses present  Psych-affect is appropriate, oriented x3 Neuro-no new focal deficits, no tremors  Data Reviewed: I have personally reviewed following labs and imaging studies  CBC: Recent Labs  Lab 12/25/22 1740 12/26/22 1050  WBC 14.4* 10.4  HGB 15.3* 13.5  HCT 43.8 38.4  MCV 77.5* 78.7*  PLT 427* [id number]   Basic Metabolic Panel: Recent Labs  Lab 12/25/22 1740 12/26/22 0949 12/27/22 0500  NA 127* 129* 129*  K 2.1* 2.2* 2.2*  CL 80* 90* 94*  CO2 '27 25 27  '$ GLUCOSE 153* 112* 115*  BUN 49* 41* 26*  CREATININE 2.09* 1.25* 1.05*  CALCIUM 9.2 7.9* 7.9*  MG 2.4  --   --   PHOS  --   --  1.9*   GFR: Estimated Creatinine Clearance: 67.7 mL/min (A) (by C-G formula based on SCr of 1.05 mg/dL (H)). Liver Function Tests: Recent Labs  Lab 12/25/22 1740 12/27/22 0500  AST 33  --   ALT 31  --   ALKPHOS 74  --   BILITOT 0.8  --   PROT 7.9  --   ALBUMIN 3.7 2.8*   Recent Results (from the past 240 hour(s))  C Difficile Quick Screen w PCR reflex     Status: None    Collection Time: 12/25/22 11:01 PM   Specimen: STOOL  Result Value Ref Range Status   C Diff antigen NEGATIVE NEGATIVE Final   C Diff toxin NEGATIVE NEGATIVE Final   C Diff interpretation No C. difficile detected.  Final    Comment: Performed at [facility], [address], [place], [place] [address]  Gastrointestinal Panel by PCR , Stool     Status: Abnormal   Collection Time: 12/25/22 11:02 PM   Specimen: STOOL  Result Value Ref Range Status  Campylobacter species NOT DETECTED NOT DETECTED Final   Plesimonas shigelloides NOT DETECTED NOT DETECTED Final   Salmonella species DETECTED (A) NOT DETECTED Final    Comment: JAMIE RIGGS'@0436'$  12/27/22 RH   Yersinia enterocolitica NOT DETECTED NOT DETECTED Final   Vibrio species NOT DETECTED NOT DETECTED Final   Vibrio cholerae NOT DETECTED NOT DETECTED Final   Enteroaggregative E coli (EAEC) NOT DETECTED NOT DETECTED Final   Enteropathogenic E coli (EPEC) NOT DETECTED NOT DETECTED Final   Enterotoxigenic E coli (ETEC) NOT DETECTED NOT DETECTED Final   Shiga like toxin producing E coli (STEC) NOT DETECTED NOT DETECTED Final   Shigella/Enteroinvasive E coli (EIEC) NOT DETECTED NOT DETECTED Final   Cryptosporidium NOT DETECTED NOT DETECTED Final   Cyclospora cayetanensis NOT DETECTED NOT DETECTED Final   Entamoeba histolytica NOT DETECTED NOT DETECTED Final   Giardia lamblia NOT DETECTED NOT DETECTED Final   Adenovirus F40/41 NOT DETECTED NOT DETECTED Final   Astrovirus NOT DETECTED NOT DETECTED Final   Norovirus GI/GII NOT DETECTED NOT DETECTED Final   Rotavirus A NOT DETECTED NOT DETECTED Final   Sapovirus (I, II, IV, and V) NOT DETECTED NOT DETECTED Final    Comment: Performed at [facility], [address], [place], [place] [address]     Radiology Studies: CT ABDOMEN PELVIS WO CONTRAST  Result Date: 12/25/2022 CLINICAL DATA:  Generalized weakness with nausea, vomiting and diarrhea. EXAM: CT ABDOMEN AND PELVIS WITHOUT  CONTRAST TECHNIQUE: Multidetector CT imaging of the abdomen and pelvis was performed following the standard protocol without IV contrast. RADIATION DOSE REDUCTION: This exam was performed according to the departmental dose-optimization program which includes automated exposure control, adjustment of the mA and/or kV according to patient size and/or use of iterative reconstruction technique. COMPARISON:  None Available. FINDINGS: Lower chest: No acute abnormality. Hepatobiliary: No focal liver abnormality is seen. Status post cholecystectomy. No biliary dilatation. Pancreas: Unremarkable. No pancreatic ductal dilatation or surrounding inflammatory changes. Spleen: Normal in size without focal abnormality. Adrenals/Urinary Tract: Adrenal glands are unremarkable. Kidneys are normal in size, without renal or hydronephrosis. A 1.9 cm right-sided parapelvic renal cyst is seen (approximately 6.66 Hounsfield units). The urinary bladder is poorly visualized secondary to overlying streak artifact. Stomach/Bowel: Stomach is within normal limits. The appendix is surgically absent. No evidence of bowel wall thickening, distention, or inflammatory changes. Vascular/Lymphatic: Aortic atherosclerosis. Numerous subcentimeter mesenteric lymph nodes are seen within the mid right abdomen, right lower quadrant and right hemipelvis (the largest measures 9 mm). Numerous tiny soft tissue nodules are seen scattered throughout pericolonic portion of the mesentery. Reproductive: The expected regions of the uterus and bilateral adnexa are limited in evaluation secondary to overlying streak artifact. Other: No abdominal wall hernia or abnormality. No abdominopelvic ascites. Musculoskeletal: Bilateral hip replacements are seen. There is an extensive amount of associated streak artifact with subsequently limited evaluation of the adjacent osseous and soft tissue structures. Marked severity degenerative changes are seen at the level of L5-S1.  IMPRESSION: 1. Numerous small mesenteric lymph nodes and tiny soft tissue nodules scattered throughout the pericolonic portion of the mesentery. While these may be reactive in nature, the presence of a neoplastic process cannot be excluded. Correlation with follow-up abdomen and pelvis CT is recommended. 2. Bilateral hip replacements. 3. Parapelvic right renal cyst. No follow-up imaging is recommended. This recommendation follows ACR consensus guidelines: Management of the Incidental Renal Mass on CT: A White Paper of the ACR Incidental Findings Committee. J Am Coll Radiol [phone]. 4. Marked severity  degenerative changes at the level of L5-S1. 5. Aortic atherosclerosis. Aortic Atherosclerosis (ICD10-I70.0). Electronically Signed   By: Virgina Norfolk M.D.   On: 12/25/2022 19:58    Scheduled Meds:  atorvastatin  20 mg Oral Daily   heparin  5,000 Units Subcutaneous Q8H   pantoprazole  40 mg Oral Daily   potassium chloride  40 mEq Oral TID   potassium chloride  40 mEq Oral Q3H   sodium chloride flush  3 mL Intravenous Q12H   Continuous Infusions:  0.9 % NaCl with KCl 20 mEq / L 150 mL/hr at 12/27/22 0937   0.9 % NaCl with KCl 40 mEq / L     cefTRIAXone (ROCEPHIN)  IV Stopped (12/27/22 0530)   potassium chloride 10 mEq (12/27/22 0835)    LOS: 2 days   Roxan Hockey M.D on 12/27/2022 at 9:44 AM  Go to [email or web address] - for contact info  [facility] - Office  [phone]  If 7PM-7AM, please contact night-coverage [email or web address] 12/27/2022, 9:44 AM

## 2022-12-27 NOTE — Progress Notes (Signed)
During med pass, pt reported "my nose is hurting." Reviewed this with pt, pt states that the day before she was admitted, she fell at home and hit her face.  At this time, noted dried bloody drainage at tip of pt nose.  No obvious visual deformities, no difficulty with air exchange in nares.  No active drainage.  Reviewed with provider, will have attending follow up with pt during rounds.

## 2022-12-27 NOTE — Progress Notes (Signed)
Call from lab, GI panel pos for salmonella.  Reviewed with provider, will start IV Rocephin.  Pt notified of results.

## 2022-12-27 NOTE — Progress Notes (Signed)
GI panel still pending.  Pt has had at least 2 episodes of loose stool overnight.  Gave imodium after second known stool.  Encouraged fluid/water intake.  Pt was unable to take hs potassium, states that her nausea was better managed with reglan but did not want to upset her stomach.  No vomiting this shift.  Pt ambulatory in room.  Denies pain.  SR on monitor.

## 2022-12-28 DIAGNOSIS — N179 Acute kidney failure, unspecified: Secondary | ICD-10-CM | POA: Diagnosis not present

## 2022-12-28 LAB — RENAL FUNCTION PANEL
Albumin: 2.6 g/dL — ABNORMAL LOW (ref 3.5–5.0)
Anion gap: 6 (ref 5–15)
BUN: 15 mg/dL (ref 6–20)
CO2: 26 mmol/L (ref 22–32)
Calcium: 7.8 mg/dL — ABNORMAL LOW (ref 8.9–10.3)
Chloride: 98 mmol/L (ref 98–111)
Creatinine, Ser: 0.86 mg/dL (ref 0.44–1.00)
GFR, Estimated: >60 mL/min (ref >60)
Glucose, Bld: 107 mg/dL — ABNORMAL HIGH (ref 70–99)
Phosphorus: 2 mg/dL — ABNORMAL LOW (ref 2.5–4.6)
Potassium: 3 mmol/L — ABNORMAL LOW (ref 3.5–5.1)
Sodium: 130 mmol/L — ABNORMAL LOW (ref 135–145)

## 2022-12-28 MED ORDER — POTASSIUM CHLORIDE 10 MEQ/100ML IV SOLN
INTRAVENOUS | Status: AC
  Administered 2022-12-28: 10 meq via INTRAVENOUS
  Filled 2022-12-28: qty 200

## 2022-12-28 MED ORDER — RISAQUAD PO CAPS
2.0000 | ORAL_CAPSULE | Freq: Three times a day (TID) | ORAL | Status: DC
  Administered 2022-12-28 – 2022-12-30 (×6): 2 via ORAL
  Filled 2022-12-28 (×6): qty 2

## 2022-12-28 MED ORDER — POTASSIUM CHLORIDE IN NACL 40-0.9 MEQ/L-% IV SOLN: INTRAVENOUS | Status: AC

## 2022-12-28 MED ORDER — POTASSIUM CHLORIDE 10 MEQ/100ML IV SOLN
10.0000 meq | INTRAVENOUS | Status: AC
  Administered 2022-12-28 (×3): 10 meq via INTRAVENOUS
  Filled 2022-12-28 (×2): qty 100

## 2022-12-28 MED ORDER — MAGNESIUM SULFATE 2 GM/50ML IV SOLN
2.0000 g | Freq: Once | INTRAVENOUS | Status: AC
  Administered 2022-12-28: 2 g via INTRAVENOUS
  Filled 2022-12-28: qty 50

## 2022-12-28 NOTE — Hospital Course (Addendum)
Hannah Allen is a 57 y.o. female with medical history significant for hypertension, hyperlipidemia, and GERD who presents to the emergency department with fatigue, generalized weakness, nausea, vomiting, and diarrhea admitted on 12/25/2022 with AKI and severe hypokalemia secondary to GI losses       1)AKI----acute kidney injury --- -resolved due to volume depletion/dehydration in the setting of vomiting and diarrhea -Creatinine on admission 2.09  baseline 0.6-0.8 -Renal function improving with IV fluids  -renally adjust medications, avoid nephrotoxic agents / dehydration  / hypotension Lab Results  Component Value Date   CREATININE 0.71 12/30/2022   CREATININE 0.84 12/29/2022   CREATININE 0.86 12/28/2022      2)Severe Persistent hypokalemia/hypophosphatemia --- due to GI losses - K: 2.2, 3.0 >>> 3.3 - potassium initially was 2.1 after replacement only up to 2.2 -Phosphorus is low------ replace -Magnesium WNL -Replace iv and orally and recheck   3) Salmonella enterocolitis-- With nausea vomiting and diarrhea/elevated lipase --- improving -Reduce diarrhea episodes -Was treated with IV Rocephin, switch to p.o. ciprofloxacin (Repeat EKG revealed resolved QTc interval prolongation)  -stool for C. difficile negative,  - GI pathogen/stool culture with Salmonella -Lipase is 255---patient denies EtOH use, LFTs are not elevated -Pancreas is unremarkable on CT abdomen and pelvis -CT abdomen pelvis with nonspecific lymphadenopathy most likely reactive--repeat CT abdomen and pelvis in 3 to 6 months as outpatient as advised -As needed antiemetics   4) hyponatremia- -sodium was down to 127 due to dehydration in the setting of nausea vomiting and diarrhea As noted above -Continue IV fluids Sodium 127, 129, 130 >>>132    5) leukocytosis--- WBC is down to 10.4 from 14.4 suspect leukocytosis is reactive in the setting of persistent emesis

## 2022-12-28 NOTE — Progress Notes (Addendum)
PROGRESS NOTE    Patient: Hannah Allen                            PCP: [name], FNP                    DOB: 03/01/1966            DOA: 12/25/2022 [id number]             DOS: 12/28/2022, 12:19 PM   LOS: 3 days   Date of Service: The patient was seen and examined on 12/28/2022  Subjective:   The patient was seen and examined this morning. Hemodynamically stable. Continue to complain of diarrhea 9 episodes in the last 24 hours  Brief Narrative:   Hannah Allen is a 57 y.o. female with medical history significant for hypertension, hyperlipidemia, and GERD who presents to the emergency department with fatigue, generalized weakness, nausea, vomiting, and diarrhea admitted on 12/25/2022 with AKI and severe hypokalemia secondary to GI losses     -Assessment and Plan:  1)AKI----acute kidney injury ---due to volume depletion/dehydration in the setting of vomiting and diarrhea -Creatinine on admission 2.09  baseline 0.6-0.8 -Renal function improving with IV fluids  -renally adjust medications, avoid nephrotoxic agents / dehydration  / hypotension Lab Results  Component Value Date   CREATININE 0.86 12/28/2022   CREATININE 1.05 (H) 12/27/2022   CREATININE 1.25 (H) 12/26/2022      2)Severe Persistent hypokalemia/hypophosphatemia --- due to GI losses - K: 2.2, 3.0  - potassium initially was 2.1 after replacement only up to 2.2 -Phosphorus is low------ replace -Magnesium WNL -Replace iv and orally and recheck   3) Salmonella enterocolitis-- With nausea vomiting and diarrhea/elevated lipase ---  -stool for C. difficile negative,  - GI pathogen/stool culture with Salmonella -Lipase is 255---patient denies EtOH use, LFTs are not elevated -Pancreas is unremarkable on CT abdomen and pelvis -CT abdomen pelvis with nonspecific lymphadenopathy most likely reactive--repeat CT abdomen and pelvis in 3 to 6 months as outpatient as advised -As needed antiemetics   4)  hyponatremia--sodium was down to 127 due to dehydration in the setting of nausea vomiting and diarrhea As noted above -Continue IV fluids Sodium 127, 129, 130 today   5) leukocytosis--- WBC is down to 10.4 from 14.4 suspect leukocytosis is reactive in the setting of persistent emesis   Disposition--unable to discharge- patient still needs IV fluids and IV potassium replacements at this time----anticipate discharge home in 24 to 48 hours from now if able to tolerate oral intake and electrolytes abnormalities can be replaced orally   Status is: Inpatient    DVT prophylaxis:  heparin injection 5,000 Units Start: 12/25/22 2200   Code Status:   Code Status: Full Code  Family Communication: Discussed with patient  no family member present at bedside- attempt will be made to update daily The above findings and plan of care has been discussed with patient  in detail,  She  expressed understanding and agreement of above. -Advance care planning has been discussed.      Disposition: From  - home             Planning for discharge in 1-2 days: to Home   Procedures:   No admission procedures for hospital encounter.   Antimicrobials:  Anti-infectives (From admission, onward)    Start     Dose/Rate Route Frequency Ordered Stop   12/27/22 0545  cefTRIAXone (ROCEPHIN) 2  g in sodium chloride 0.9 % 100 mL IVPB        2 g 200 mL/hr over 30 Minutes Intravenous Every 24 hours 12/27/22 0450          Medication:   acidophilus  2 capsule Oral TID   atorvastatin  20 mg Oral Daily   heparin  5,000 Units Subcutaneous Q8H   sodium chloride flush  3 mL Intravenous Q12H    acetaminophen **OR** acetaminophen, loperamide, metoCLOPramide (REGLAN) injection   Objective:   Vitals:   12/27/22 0458 12/27/22 1411 12/27/22 1952 12/28/22 0411  BP:  (!) 103/59 (!) 110/55 (!) 95/58  Pulse:  73 74 75  Resp:  '18 18 18  '$ Temp:  97.9 F (36.6 C) 98 F (36.7 C) 98.6 F (37 C)  TempSrc:  Oral Oral  Oral  SpO2:  100% 98% 98%  Weight: 106 kg   107.2 kg  Height: '5\' 2"'$  (1.575 m)   '5\' 2"'$  (1.575 m)    Intake/Output Summary (Last 24 hours) at 12/28/2022 1219 Last data filed at 12/28/2022 0831 Gross per 24 hour  Intake 3103.3 ml  Output --  Net 3103.3 ml   Filed Weights   12/26/22 0821 12/27/22 0458 12/28/22 0411  Weight: 102.4 kg 106 kg 107.2 kg     Physical examination:   Constitution:  Alert, cooperative, no distress,  Appears calm and comfortable  Psychiatric:   Normal and stable mood and affect, cognition intact,   HEENT:        Normocephalic, PERRL, otherwise with in Normal limits  Chest:         Chest symmetric Cardio vascular:  S1/S2, RRR, No murmure, No Rubs or Gallops  pulmonary: Clear to auscultation bilaterally, respirations unlabored, negative wheezes / crackles Abdomen: Soft, non-tender, non-distended, bowel sounds,no masses, no organomegaly Muscular skeletal: Limited exam - in bed, able to move all 4 extremities,   Neuro: CNII-XII intact. , normal motor and sensation, reflexes intact  Extremities: No pitting edema lower extremities, +2 pulses  Skin: Dry, warm to touch, negative for any Rashes, No open wounds Wounds: per nursing documentation   ------------------------------------------------------------------------------------------------------------------------------------------    LABs:     Latest Ref Rng & Units 12/26/2022   10:50 AM 12/25/2022    5:40 PM 01/19/2022   11:27 AM  CBC  WBC 4.0 - 10.5 K/uL 10.4  14.4  5.4   Hemoglobin 12.0 - 15.0 g/dL 13.5  15.3  13.1   Hematocrit 36.0 - 46.0 % 38.4  43.8  39.9   Platelets 150 - 400 K/uL 316  427  329       Latest Ref Rng & Units 12/28/2022    5:46 AM 12/27/2022    5:00 AM 12/26/2022    9:49 AM  CMP  Glucose 70 - 99 mg/dL 107  115  112   BUN 6 - 20 mg/dL 15  26  41   Creatinine 0.44 - 1.00 mg/dL 0.86  1.05  1.25   Sodium 135 - 145 mmol/L 130  129  129   Potassium 3.5 - 5.1 mmol/L 3.0  2.2  2.2    Chloride 98 - 111 mmol/L 98  94  90   CO2 22 - 32 mmol/L '26  27  25   '$ Calcium 8.9 - 10.3 mg/dL 7.8  7.9  7.9        Micro Results Recent Results (from the past 240 hour(s))  C Difficile Quick Screen w PCR reflex     Status:  None   Collection Time: 12/25/22 11:01 PM   Specimen: STOOL  Result Value Ref Range Status   C Diff antigen NEGATIVE NEGATIVE Final   C Diff toxin NEGATIVE NEGATIVE Final   C Diff interpretation No C. difficile detected.  Final    Comment: Performed at [facility], [address] [place], [place] [address]  Gastrointestinal Panel by PCR , Stool     Status: Abnormal   Collection Time: 12/25/22 11:02 PM   Specimen: STOOL  Result Value Ref Range Status   Campylobacter species NOT DETECTED NOT DETECTED Final   Plesimonas shigelloides NOT DETECTED NOT DETECTED Final   Salmonella species DETECTED (A) NOT DETECTED Final    Comment: JAMIE RIGGS'@0436'$  12/27/22 RH   Yersinia enterocolitica NOT DETECTED NOT DETECTED Final   Vibrio species NOT DETECTED NOT DETECTED Final   Vibrio cholerae NOT DETECTED NOT DETECTED Final   Enteroaggregative E coli (EAEC) NOT DETECTED NOT DETECTED Final   Enteropathogenic E coli (EPEC) NOT DETECTED NOT DETECTED Final   Enterotoxigenic E coli (ETEC) NOT DETECTED NOT DETECTED Final   Shiga like toxin producing E coli (STEC) NOT DETECTED NOT DETECTED Final   Shigella/Enteroinvasive E coli (EIEC) NOT DETECTED NOT DETECTED Final   Cryptosporidium NOT DETECTED NOT DETECTED Final   Cyclospora cayetanensis NOT DETECTED NOT DETECTED Final   Entamoeba histolytica NOT DETECTED NOT DETECTED Final   Giardia lamblia NOT DETECTED NOT DETECTED Final   Adenovirus F40/41 NOT DETECTED NOT DETECTED Final   Astrovirus NOT DETECTED NOT DETECTED Final   Norovirus GI/GII NOT DETECTED NOT DETECTED Final   Rotavirus A NOT DETECTED NOT DETECTED Final   Sapovirus (I, II, IV, and V) NOT DETECTED NOT DETECTED Final    Comment: Performed at [facility],  [address], [place], [place] [address]    Radiology Reports No results found.  SIGNED: Deatra James, MD, FHM. FAAFP. Zacarias Pontes - [facility] Time spent > 50  min.  In seeing, evaluating and examining the patient. Reviewing medical records, labs, drawn plan of care. [facility],  Pager (please use [email or web address] to page/ text) Please use Epic Secure Chat for non-urgent communication (7AM-7PM)  If 7PM-7AM, please contact night-coverage [email or web address], 12/28/2022, 12:19 PM

## 2022-12-28 NOTE — Progress Notes (Signed)
Pt continues to have loose stools during noc.  Encouraged pt to continue oral hydration during noc.  Pt did drink full cup of water and has otherwise had milk and jello.  Pt completed all of potassium replacement.  Denies any pain/nausea this shift.   Pt SR on monitor.

## 2022-12-29 DIAGNOSIS — N179 Acute kidney failure, unspecified: Secondary | ICD-10-CM | POA: Diagnosis not present

## 2022-12-29 LAB — BASIC METABOLIC PANEL
Anion gap: 10 (ref 5–15)
BUN: 12 mg/dL (ref 6–20)
CO2: 26 mmol/L (ref 22–32)
Calcium: 7.9 mg/dL — ABNORMAL LOW (ref 8.9–10.3)
Chloride: 96 mmol/L — ABNORMAL LOW (ref 98–111)
Creatinine, Ser: 0.84 mg/dL (ref 0.44–1.00)
GFR, Estimated: >60 mL/min (ref >60)
Glucose, Bld: 122 mg/dL — ABNORMAL HIGH (ref 70–99)
Potassium: 3.3 mmol/L — ABNORMAL LOW (ref 3.5–5.1)
Sodium: 132 mmol/L — ABNORMAL LOW (ref 135–145)

## 2022-12-29 MED ORDER — POTASSIUM CHLORIDE CRYS ER 20 MEQ PO TBCR
20.0000 meq | EXTENDED_RELEASE_TABLET | Freq: Once | ORAL | Status: AC
  Administered 2022-12-29: 20 meq via ORAL
  Filled 2022-12-29: qty 1

## 2022-12-29 MED ORDER — POTASSIUM CHLORIDE IN NACL 40-0.9 MEQ/L-% IV SOLN: INTRAVENOUS | Status: AC

## 2022-12-29 NOTE — Progress Notes (Signed)
Continues to have multiple episodes of diarrhea in bedside commode.  No c/o nausea or vomiting and started solid food at lunch time.  Restarted on NS with 40k

## 2022-12-29 NOTE — Progress Notes (Signed)
PROGRESS NOTE    Patient: Hannah Allen                            PCP: [name], FNP                    DOB: [birth year]            DOA: 12/25/2022 [id number]             DOS: 12/29/2022, 12:22 PM   LOS: 4 days   Date of Service: The patient was seen and examined on 12/29/2022  Subjective:   The patient was seen and examined this morning, reporting improved abdominal pain, no nausea or vomiting, has had 4 episode of watery diarrhea in the past 12 hours Wanting to try soft diet  Brief Narrative:   Marjean Franceschini is a 57 y.o. female with medical history significant for hypertension, hyperlipidemia, and GERD who presents to the emergency department with fatigue, generalized weakness, nausea, vomiting, and diarrhea admitted on 12/25/2022 with AKI and severe hypokalemia secondary to GI losses     -Assessment and Plan:  1)AKI----acute kidney injury ---due to volume depletion/dehydration in the setting of vomiting and diarrhea -Creatinine on admission 2.09  baseline 0.6-0.8 -Renal function improving with IV fluids  -renally adjust medications, avoid nephrotoxic agents / dehydration  / hypotension Lab Results  Component Value Date   CREATININE 0.84 12/29/2022   CREATININE 0.86 12/28/2022   CREATININE 1.05 (H) 12/27/2022      2)Severe Persistent hypokalemia/hypophosphatemia --- due to GI losses - K: 2.2, 3.0 >>> 3.3 - potassium initially was 2.1 after replacement only up to 2.2 -Phosphorus is low------ replace -Magnesium WNL -Replace iv and orally and recheck   3) Salmonella enterocolitis-- With nausea vomiting and diarrhea/elevated lipase --- improving -Still having watery diarrhea  -stool for C. difficile negative,  - GI pathogen/stool culture with Salmonella -Lipase is 255---patient denies EtOH use, LFTs are not elevated -Pancreas is unremarkable on CT abdomen and pelvis -CT abdomen pelvis with nonspecific lymphadenopathy most likely reactive--repeat CT abdomen and  pelvis in 3 to 6 months as outpatient as advised -As needed antiemetics   4) hyponatremia- -sodium was down to 127 due to dehydration in the setting of nausea vomiting and diarrhea As noted above -Continue IV fluids Sodium 127, 129, 130 >>>132 today   5) leukocytosis--- WBC is down to 10.4 from 14.4 suspect leukocytosis is reactive in the setting of persistent emesis   Disposition--unable to discharge- patient still needs IV fluids and IV potassium replacements at this time----anticipate discharge home in 24 to 48 hours from now if able to tolerate oral intake and electrolytes abnormalities can be replaced orally   Status is: Inpatient    DVT prophylaxis:  heparin injection 5,000 Units Start: 12/25/22 2200   Code Status:   Code Status: Full Code  Family Communication: Discussed with patient  no family member present at bedside- attempt will be made to update daily The above findings and plan of care has been discussed with patient  in detail,  She  expressed understanding and agreement of above. -Advance care planning has been discussed.      Disposition: From  - home             Planning for discharge in 1-2 days: to Home   Procedures:   No admission procedures for hospital encounter.   Antimicrobials:  Anti-infectives (From admission, onward)  Start     Dose/Rate Route Frequency Ordered Stop   12/27/22 0545  cefTRIAXone (ROCEPHIN) 2 g in sodium chloride 0.9 % 100 mL IVPB        2 g 200 mL/hr over 30 Minutes Intravenous Every 24 hours 12/27/22 0450          Medication:   acidophilus  2 capsule Oral TID   atorvastatin  20 mg Oral Daily   heparin  5,000 Units Subcutaneous Q8H   sodium chloride flush  3 mL Intravenous Q12H    acetaminophen **OR** acetaminophen, loperamide, metoCLOPramide (REGLAN) injection   Objective:   Vitals:   12/28/22 2112 12/28/22 2156 12/29/22 0335 12/29/22 0442  BP: (!) 95/46 (!) 107/55 (!) 93/58   Pulse: 70 73 66   Resp: '18 20  13   '$ Temp: (!) 100.9 F (38.3 C) 98 F (36.7 C) 98.7 F (37.1 C)   TempSrc: Oral Oral Oral   SpO2: 98% 99% 100%   Weight:    105.6 kg  Height:        Intake/Output Summary (Last 24 hours) at 12/29/2022 1222 Last data filed at 12/29/2022 0300 Gross per 24 hour  Intake 2070.2 ml  Output --  Net 2070.2 ml   Filed Weights   12/27/22 0458 12/28/22 0411 12/29/22 0442  Weight: 106 kg 107.2 kg 105.6 kg     Physical examination:   General:  AAO x 3,  cooperative, no distress;   HEENT:  Normocephalic, PERRL, otherwise with in Normal limits   Neuro:  CNII-XII intact. , normal motor and sensation, reflexes intact   Lungs:   Clear to auscultation BL, Respirations unlabored,  No wheezes / crackles  Cardio:    S1/S2, RRR, No murmure, No Rubs or Gallops   Abdomen:  Soft, non-tender, bowel sounds active all four quadrants, no guarding or peritoneal signs.  Muscular  skeletal:  Limited exam -global generalized weaknesses - in bed, able to move all 4 extremities,   2+ pulses,  symmetric, No pitting edema  Skin:  Dry, warm to touch, negative for any Rashes,  Wounds: Please see nursing documentation         ------------------------------------------------------------------------------------------------------------------------------------------    LABs:     Latest Ref Rng & Units 12/26/2022   10:50 AM 12/25/2022    5:40 PM 01/19/2022   11:27 AM  CBC  WBC 4.0 - 10.5 K/uL 10.4  14.4  5.4   Hemoglobin 12.0 - 15.0 g/dL 13.5  15.3  13.1   Hematocrit 36.0 - 46.0 % 38.4  43.8  39.9   Platelets 150 - 400 K/uL 316  427  329       Latest Ref Rng & Units 12/29/2022    8:15 AM 12/28/2022    5:46 AM 12/27/2022    5:00 AM  CMP  Glucose 70 - 99 mg/dL 122  107  115   BUN 6 - 20 mg/dL '12  15  26   '$ Creatinine 0.44 - 1.00 mg/dL 0.84  0.86  1.05   Sodium 135 - 145 mmol/L 132  130  129   Potassium 3.5 - 5.1 mmol/L 3.3  3.0  2.2   Chloride 98 - 111 mmol/L 96  98  94   CO2 22 - 32 mmol/L '26  26   27   '$ Calcium 8.9 - 10.3 mg/dL 7.9  7.8  7.9        Micro Results Recent Results (from the past 240 hour(s))  C Difficile Quick Screen w  PCR reflex     Status: None   Collection Time: 12/25/22 11:01 PM   Specimen: STOOL  Result Value Ref Range Status   C Diff antigen NEGATIVE NEGATIVE Final   C Diff toxin NEGATIVE NEGATIVE Final   C Diff interpretation No C. difficile detected.  Final    Comment: Performed at [facility], [address], [place], [place] [address]  Gastrointestinal Panel by PCR , Stool     Status: Abnormal   Collection Time: 12/25/22 11:02 PM   Specimen: STOOL  Result Value Ref Range Status   Campylobacter species NOT DETECTED NOT DETECTED Final   Plesimonas shigelloides NOT DETECTED NOT DETECTED Final   Salmonella species DETECTED (A) NOT DETECTED Final    Comment: JAMIE RIGGS'@0436'$  12/27/22 RH   Yersinia enterocolitica NOT DETECTED NOT DETECTED Final   Vibrio species NOT DETECTED NOT DETECTED Final   Vibrio cholerae NOT DETECTED NOT DETECTED Final   Enteroaggregative E coli (EAEC) NOT DETECTED NOT DETECTED Final   Enteropathogenic E coli (EPEC) NOT DETECTED NOT DETECTED Final   Enterotoxigenic E coli (ETEC) NOT DETECTED NOT DETECTED Final   Shiga like toxin producing E coli (STEC) NOT DETECTED NOT DETECTED Final   Shigella/Enteroinvasive E coli (EIEC) NOT DETECTED NOT DETECTED Final   Cryptosporidium NOT DETECTED NOT DETECTED Final   Cyclospora cayetanensis NOT DETECTED NOT DETECTED Final   Entamoeba histolytica NOT DETECTED NOT DETECTED Final   Giardia lamblia NOT DETECTED NOT DETECTED Final   Adenovirus F40/41 NOT DETECTED NOT DETECTED Final   Astrovirus NOT DETECTED NOT DETECTED Final   Norovirus GI/GII NOT DETECTED NOT DETECTED Final   Rotavirus A NOT DETECTED NOT DETECTED Final   Sapovirus (I, II, IV, and V) NOT DETECTED NOT DETECTED Final    Comment: Performed at [facility], [address] [place], [place] [address]    Radiology  Reports No results found.  SIGNED: Deatra James, MD, FHM. FAAFP. Zacarias Pontes - [facility] Time spent > 35 min-  In seeing, evaluating and examining the patient. Reviewing medical records, labs, drawn plan of care. [facility],  Pager (please use [email or web address] to page/ text) Please use Epic Secure Chat for non-urgent communication (7AM-7PM)  If 7PM-7AM, please contact night-coverage [email or web address], 12/29/2022, 12:22 PM

## 2022-12-30 DIAGNOSIS — N179 Acute kidney failure, unspecified: Secondary | ICD-10-CM | POA: Diagnosis not present

## 2022-12-30 LAB — BASIC METABOLIC PANEL
Anion gap: 8 (ref 5–15)
BUN: 12 mg/dL (ref 6–20)
CO2: 25 mmol/L (ref 22–32)
Calcium: 7.9 mg/dL — ABNORMAL LOW (ref 8.9–10.3)
Chloride: 99 mmol/L (ref 98–111)
Creatinine, Ser: 0.71 mg/dL (ref 0.44–1.00)
GFR, Estimated: >60 mL/min (ref >60)
Glucose, Bld: 97 mg/dL (ref 70–99)
Potassium: 3.5 mmol/L (ref 3.5–5.1)
Sodium: 132 mmol/L — ABNORMAL LOW (ref 135–145)

## 2022-12-30 MED ORDER — LACTINEX PO CHEW: 1.0000 | CHEWABLE_TABLET | Freq: Three times a day (TID) | ORAL | 0 refills | Status: AC

## 2022-12-30 MED ORDER — LACTATED RINGERS IV BOLUS
1000.0000 mL | Freq: Once | INTRAVENOUS | Status: AC
  Administered 2022-12-30: 1000 mL via INTRAVENOUS

## 2022-12-30 MED ORDER — LOPERAMIDE HCL 2 MG PO CAPS: 2.0000 mg | ORAL_CAPSULE | Freq: Four times a day (QID) | ORAL | 0 refills | Status: AC | PRN

## 2022-12-30 MED ORDER — CIPROFLOXACIN HCL 500 MG PO TABS: 500.0000 mg | ORAL_TABLET | Freq: Two times a day (BID) | ORAL | 0 refills | Status: AC

## 2022-12-30 MED ORDER — POTASSIUM CHLORIDE CRYS ER 20 MEQ PO TBCR
20.0000 meq | EXTENDED_RELEASE_TABLET | Freq: Once | ORAL | Status: AC
  Administered 2022-12-30: 20 meq via ORAL
  Filled 2022-12-30: qty 1

## 2022-12-30 MED ORDER — ONDANSETRON HCL 4 MG PO TABS: 4.0000 mg | ORAL_TABLET | Freq: Three times a day (TID) | ORAL | 0 refills | Status: AC | PRN

## 2022-12-30 NOTE — Discharge Summary (Signed)
Physician Discharge Summary   Patient: Hannah Allen MRN: [id number] DOB: July 22, 1966  Admit date:     12/25/2022  Discharge date: 12/30/22  Discharge Physician: Deatra James   PCP: [name], FNP   Recommendations at discharge:  The PCP and 2-4 weeks  Discharge Diagnoses: Principal Problem:   AKI (acute kidney injury) ([place]) Active Problems:   Essential hypertension   Hypokalemia   Hyponatremia   Nausea vomiting and diarrhea   Prolonged QT interval   Mesenteric lymphadenopathy  Resolved Problems:   * No resolved hospital problems. *  Hospital Course: Hannah Allen is a 57 y.o. female with medical history significant for hypertension, hyperlipidemia, and GERD who presents to the emergency department with fatigue, generalized weakness, nausea, vomiting, and diarrhea admitted on 12/25/2022 with AKI and severe hypokalemia secondary to GI losses       1)AKI----acute kidney injury --- -resolved due to volume depletion/dehydration in the setting of vomiting and diarrhea -Creatinine on admission 2.09  baseline 0.6-0.8 -Renal function improving with IV fluids  -renally adjust medications, avoid nephrotoxic agents / dehydration  / hypotension Lab Results  Component Value Date   CREATININE 0.71 12/30/2022   CREATININE 0.84 12/29/2022   CREATININE 0.86 12/28/2022      2)Severe Persistent hypokalemia/hypophosphatemia --- due to GI losses - K: 2.2, 3.0 >>> 3.3 - potassium initially was 2.1 after replacement only up to 2.2 -Phosphorus is low------ replace -Magnesium WNL -Replace iv and orally and recheck   3) Salmonella enterocolitis-- With nausea vomiting and diarrhea/elevated lipase --- improving -Reduce diarrhea episodes -Was treated with IV Rocephin, switch to p.o. ciprofloxacin (Repeat EKG revealed resolved QTc interval prolongation)  -stool for C. difficile negative,  - GI pathogen/stool culture with Salmonella -Lipase is 255---patient denies EtOH use, LFTs  are not elevated -Pancreas is unremarkable on CT abdomen and pelvis -CT abdomen pelvis with nonspecific lymphadenopathy most likely reactive--repeat CT abdomen and pelvis in 3 to 6 months as outpatient as advised -As needed antiemetics   4) hyponatremia- -sodium was down to 127 due to dehydration in the setting of nausea vomiting and diarrhea As noted above -Continue IV fluids Sodium 127, 129, 130 >>>132    5) leukocytosis--- WBC is down to 10.4 from 14.4 suspect leukocytosis is reactive in the setting of persistent emesis         Disposition: Home Diet recommendation:  Discharge Diet Orders (From admission, onward)     Start     Ordered   12/30/22 0000  Diet - low sodium heart healthy        12/30/22 [id number]           Cardiac diet DISCHARGE MEDICATION: Allergies as of 12/30/2022       Reactions   Lisinopril    ?angioedema        Medication List     STOP taking these medications    famotidine 20 MG tablet Commonly known as: PEPCID   HYDROcodone-acetaminophen 5-325 MG tablet Commonly known as: Norco   pantoprazole 40 MG tablet Commonly known as: PROTONIX   polyethylene glycol 17 g packet Commonly known as: MiraLax       TAKE these medications    amLODipine 10 MG tablet Commonly known as: NORVASC Take 1 tablet (10 mg total) by mouth daily.   atorvastatin 20 MG tablet Commonly known as: LIPITOR Take 20 mg by mouth daily.   chlorthalidone 25 MG tablet Commonly known as: HYGROTON Take 25 mg by mouth daily.   ciprofloxacin 500  MG tablet Commonly known as: Cipro Take 1 tablet (500 mg total) by mouth 2 (two) times daily for 5 days.   lactobacillus acidophilus & bulgar chewable tablet Chew 1 tablet by mouth 3 (three) times daily with meals for 10 days.   loperamide 2 MG capsule Commonly known as: IMODIUM Take 1 capsule (2 mg total) by mouth every 6 (six) hours as needed for diarrhea or loose stools.   meloxicam 15 MG tablet Commonly known as:  Mobic Take 1 tablet (15 mg total) by mouth daily.   methocarbamol 500 MG tablet Commonly known as: Robaxin Take 1 tablet (500 mg total) by mouth every 6 (six) hours as needed for muscle spasms.   ondansetron 4 MG tablet Commonly known as: Zofran Take 1 tablet (4 mg total) by mouth every 8 (eight) hours as needed for up to 10 days for nausea or vomiting.        Discharge Exam: Filed Weights   12/28/22 0411 12/29/22 0442 12/30/22 0250  Weight: 107.2 kg 105.6 kg 104.5 kg        General:  AAO x 3,  cooperative, no distress;   HEENT:  Normocephalic, PERRL, otherwise with in Normal limits   Neuro:  CNII-XII intact. , normal motor and sensation, reflexes intact   Lungs:   Clear to auscultation BL, Respirations unlabored,  No wheezes / crackles  Cardio:    S1/S2, RRR, No murmure, No Rubs or Gallops   Abdomen:  Soft, non-tender, bowel sounds active all four quadrants, no guarding or peritoneal signs.  Muscular  skeletal:  Limited exam -global generalized weaknesses - in bed, able to move all 4 extremities,   2+ pulses,  symmetric, No pitting edema  Skin:  Dry, warm to touch, negative for any Rashes,  Wounds: Please see nursing documentation          Condition at discharge: good  The results of significant diagnostics from this hospitalization (including imaging, microbiology, ancillary and laboratory) are listed below for reference.   Imaging Studies: CT ABDOMEN PELVIS WO CONTRAST  Result Date: 12/25/2022 CLINICAL DATA:  Generalized weakness with nausea, vomiting and diarrhea. EXAM: CT ABDOMEN AND PELVIS WITHOUT CONTRAST TECHNIQUE: Multidetector CT imaging of the abdomen and pelvis was performed following the standard protocol without IV contrast. RADIATION DOSE REDUCTION: This exam was performed according to the departmental dose-optimization program which includes automated exposure control, adjustment of the mA and/or kV according to patient size and/or use of iterative  reconstruction technique. COMPARISON:  None Available. FINDINGS: Lower chest: No acute abnormality. Hepatobiliary: No focal liver abnormality is seen. Status post cholecystectomy. No biliary dilatation. Pancreas: Unremarkable. No pancreatic ductal dilatation or surrounding inflammatory changes. Spleen: Normal in size without focal abnormality. Adrenals/Urinary Tract: Adrenal glands are unremarkable. Kidneys are normal in size, without renal or hydronephrosis. A 1.9 cm right-sided parapelvic renal cyst is seen (approximately 6.66 Hounsfield units). The urinary bladder is poorly visualized secondary to overlying streak artifact. Stomach/Bowel: Stomach is within normal limits. The appendix is surgically absent. No evidence of bowel wall thickening, distention, or inflammatory changes. Vascular/Lymphatic: Aortic atherosclerosis. Numerous subcentimeter mesenteric lymph nodes are seen within the mid right abdomen, right lower quadrant and right hemipelvis (the largest measures 9 mm). Numerous tiny soft tissue nodules are seen scattered throughout pericolonic portion of the mesentery. Reproductive: The expected regions of the uterus and bilateral adnexa are limited in evaluation secondary to overlying streak artifact. Other: No abdominal wall hernia or abnormality. No abdominopelvic ascites. Musculoskeletal: Bilateral hip replacements are  seen. There is an extensive amount of associated streak artifact with subsequently limited evaluation of the adjacent osseous and soft tissue structures. Marked severity degenerative changes are seen at the level of L5-S1. IMPRESSION: 1. Numerous small mesenteric lymph nodes and tiny soft tissue nodules scattered throughout the pericolonic portion of the mesentery. While these may be reactive in nature, the presence of a neoplastic process cannot be excluded. Correlation with follow-up abdomen and pelvis CT is recommended. 2. Bilateral hip replacements. 3. Parapelvic right renal cyst. No  follow-up imaging is recommended. This recommendation follows ACR consensus guidelines: Management of the Incidental Renal Mass on CT: A White Paper of the ACR Incidental Findings Committee. J Am Coll Radiol [phone]. 4. Marked severity degenerative changes at the level of L5-S1. 5. Aortic atherosclerosis. Aortic Atherosclerosis (ICD10-I70.0). Electronically Signed   By: Virgina Norfolk M.D.   On: 12/25/2022 19:58    Microbiology: Results for orders placed or performed during the hospital encounter of 12/25/22  C Difficile Quick Screen w PCR reflex     Status: None   Collection Time: 12/25/22 11:01 PM   Specimen: STOOL  Result Value Ref Range Status   C Diff antigen NEGATIVE NEGATIVE Final   C Diff toxin NEGATIVE NEGATIVE Final   C Diff interpretation No C. difficile detected.  Final    Comment: Performed at [facility], [address], [place], [place] [address]  Gastrointestinal Panel by PCR , Stool     Status: Abnormal   Collection Time: 12/25/22 11:02 PM   Specimen: STOOL  Result Value Ref Range Status   Campylobacter species NOT DETECTED NOT DETECTED Final   Plesimonas shigelloides NOT DETECTED NOT DETECTED Final   Salmonella species DETECTED (A) NOT DETECTED Final    Comment: JAMIE RIGGS'@0436'$  12/27/22 RH   Yersinia enterocolitica NOT DETECTED NOT DETECTED Final   Vibrio species NOT DETECTED NOT DETECTED Final   Vibrio cholerae NOT DETECTED NOT DETECTED Final   Enteroaggregative E coli (EAEC) NOT DETECTED NOT DETECTED Final   Enteropathogenic E coli (EPEC) NOT DETECTED NOT DETECTED Final   Enterotoxigenic E coli (ETEC) NOT DETECTED NOT DETECTED Final   Shiga like toxin producing E coli (STEC) NOT DETECTED NOT DETECTED Final   Shigella/Enteroinvasive E coli (EIEC) NOT DETECTED NOT DETECTED Final   Cryptosporidium NOT DETECTED NOT DETECTED Final   Cyclospora cayetanensis NOT DETECTED NOT DETECTED Final   Entamoeba histolytica NOT DETECTED NOT DETECTED Final   Giardia lamblia  NOT DETECTED NOT DETECTED Final   Adenovirus F40/41 NOT DETECTED NOT DETECTED Final   Astrovirus NOT DETECTED NOT DETECTED Final   Norovirus GI/GII NOT DETECTED NOT DETECTED Final   Rotavirus A NOT DETECTED NOT DETECTED Final   Sapovirus (I, II, IV, and V) NOT DETECTED NOT DETECTED Final    Comment: Performed at [facility], [address] [place],  [address]    Labs: CBC: Recent Labs  Lab 12/25/22 1740 12/26/22 1050  WBC 14.4* 10.4  HGB 15.3* 13.5  HCT 43.8 38.4  MCV 77.5* 78.7*  PLT 427* [id number]   Basic Metabolic Panel: Recent Labs  Lab 12/25/22 1740 12/26/22 0949 12/27/22 0500 12/28/22 0546 12/29/22 0815 12/30/22 0506  NA 127* 129* 129* 130* 132* 132*  K 2.1* 2.2* 2.2* 3.0* 3.3* 3.5  CL 80* 90* 94* 98 96* 99  CO2 '27 25 27 26 26 25  '$ GLUCOSE 153* 112* 115* 107* 122* 97  BUN 49* 41* 26* '15 12 12  '$ CREATININE 2.09* 1.25* 1.05* 0.86 0.84 0.71  CALCIUM 9.2 7.9* 7.9* 7.8* 7.9* 7.9*  MG 2.4  --   --   --   --   --   PHOS  --   --  1.9* 2.0*  --   --    Liver Function Tests: Recent Labs  Lab 12/25/22 1740 12/27/22 0500 12/28/22 0546  AST 33  --   --   ALT 31  --   --   ALKPHOS 74  --   --   BILITOT 0.8  --   --   PROT 7.9  --   --   ALBUMIN 3.7 2.8* 2.6*   CBG: No results for input(s): "GLUCAP" in the last 168 hours.  Discharge time spent: greater than 40 minutes.  Signed: Deatra James, MD [facility] 12/30/2022

## 2022-12-30 NOTE — Progress Notes (Signed)
States diarrhea has slowed down.  No c/o nausea.  Has been eating pretty well.  DC instructions reviewed and work note given Scripts sent to pharmacy.  Waiting on ride home

## 2023-01-25 ENCOUNTER — Other Ambulatory Visit (HOSPITAL_COMMUNITY): Payer: Self-pay | Admitting: Nurse Practitioner

## 2023-01-25 DIAGNOSIS — R935 Abnormal findings on diagnostic imaging of other abdominal regions, including retroperitoneum: Secondary | ICD-10-CM

## 2023-02-22 ENCOUNTER — Other Ambulatory Visit (HOSPITAL_COMMUNITY): Payer: Self-pay | Admitting: Family Medicine

## 2023-02-22 DIAGNOSIS — R935 Abnormal findings on diagnostic imaging of other abdominal regions, including retroperitoneum: Secondary | ICD-10-CM

## 2023-02-27 ENCOUNTER — Other Ambulatory Visit: Payer: Self-pay | Admitting: Family Medicine

## 2023-02-27 DIAGNOSIS — R935 Abnormal findings on diagnostic imaging of other abdominal regions, including retroperitoneum: Secondary | ICD-10-CM

## 2023-07-27 ENCOUNTER — Other Ambulatory Visit (HOSPITAL_COMMUNITY): Payer: Self-pay | Admitting: Family Medicine

## 2023-07-27 DIAGNOSIS — Z1231 Encounter for screening mammogram for malignant neoplasm of breast: Secondary | ICD-10-CM

## 2024-02-23 ENCOUNTER — Other Ambulatory Visit (HOSPITAL_COMMUNITY): Payer: Self-pay | Admitting: Nurse Practitioner

## 2024-02-23 DIAGNOSIS — Z1231 Encounter for screening mammogram for malignant neoplasm of breast: Secondary | ICD-10-CM

## 2024-02-23 DIAGNOSIS — R42 Dizziness and giddiness: Secondary | ICD-10-CM

## 2024-02-26 ENCOUNTER — Encounter (INDEPENDENT_AMBULATORY_CARE_PROVIDER_SITE_OTHER): Payer: Self-pay

## 2024-02-26 ENCOUNTER — Telehealth: Payer: Self-pay | Admitting: Family Medicine

## 2024-02-26 ENCOUNTER — Ambulatory Visit: Attending: Nurse Practitioner

## 2024-02-26 DIAGNOSIS — I6523 Occlusion and stenosis of bilateral carotid arteries: Secondary | ICD-10-CM | POA: Diagnosis not present

## 2024-02-26 DIAGNOSIS — R42 Dizziness and giddiness: Secondary | ICD-10-CM

## 2024-02-26 NOTE — Telephone Encounter (Signed)
 Checking percert on the following patient    Cartoid study [facility] Office today 02/26/2024)

## 2024-02-28 ENCOUNTER — Ambulatory Visit (HOSPITAL_COMMUNITY)
Admission: RE | Admit: 2024-02-28 | Discharge: 2024-02-28 | Disposition: A | Discharge status: Home / Self Care | Source: Ambulatory Visit | Attending: Nurse Practitioner | Admitting: Nurse Practitioner

## 2024-02-28 DIAGNOSIS — Z1231 Encounter for screening mammogram for malignant neoplasm of breast: Secondary | ICD-10-CM | POA: Diagnosis present

## 2024-03-13 ENCOUNTER — Encounter

## 2024-04-22 NOTE — Progress Notes (Unsigned)
 [facility] [place]  ASSESSMENT / PLAN: Hannah Allen is a 58 y.o. female with asymptomatic bilateral carotid artery stenosis.   Recommend:  Abstinence from all tobacco products. Blood glucose control with goal A1c < 7%. Blood pressure control with goal blood pressure < 130/80 mmHg. Lipid reduction therapy with goal LDL-C < 55 mg/dL. Aspirin  81mg  by mouth daily. Atorvastatin  40-80mg  PO QD (or other high intensity statin therapy).  No role for carotid revascularization at this time.  Follow-up with me in 1 year with repeat carotid duplex.   CHIEF COMPLAINT: Near syncope  HISTORY OF PRESENT ILLNESS: Hannah Allen is a 58 y.o. female referred to clinic for evaluation of carotid atherosclerosis identified on recent duplex.  This was performed during the workup for near syncope.  The patient reports near syncope with deep inspiration or laughing.  Carotid duplex was performed by her primary care physician which did show some mild atherosclerotic change in bilateral carotid arteries.  The patient denies any history of stroke or TIA.   Past Medical History:  Diagnosis Date   Allergy    Anxiety    Arthritis    COVID    GERD (gastroesophageal reflux disease)    Headache    Hyperlipidemia    Hypertension    Hypothyroidism    Pre-diabetes    Varicose veins     Past Surgical History:  Procedure Laterality Date   CHOLECYSTECTOMY     TOTAL HIP ARTHROPLASTY Right 12/02/2021   Procedure: TOTAL HIP ARTHROPLASTY ANTERIOR APPROACH;  Surgeon: Fidel Rogue, MD;  Location: [facility];  Service: Orthopedics;  Laterality: Right;  150   TOTAL HIP ARTHROPLASTY Left 02/02/2022   Procedure: TOTAL HIP ARTHROPLASTY ANTERIOR APPROACH;  Surgeon: Fidel Rogue, MD;  Location: [facility];  Service: Orthopedics;  Laterality: Left;  150   TUBAL LIGATION      Family History  Problem Relation Age of Onset   Heart disease Maternal Grandmother    Diabetes Maternal Grandmother      Social History   Socioeconomic History   Marital status: Single    Spouse name: Not on file   Number of children: Not on file   Years of education: Not on file   Highest education level: Not on file  Occupational History   Not on file  Tobacco Use   Smoking status: Former    Current packs/day: 0.00    Types: Cigarettes    Quit date: 08/11/2011    Years since quitting: 12.7   Smokeless tobacco: Never   Tobacco comments:    quit 6 months  Vaping Use   Vaping status: Never Used  Substance and Sexual Activity   Alcohol  use: No   Drug use: No   Sexual activity: Not on file  Other Topics Concern   Not on file  Social History Narrative   Not on file   Social Drivers of Health   Financial Resource Strain: Not on file  Food Insecurity: No Food Insecurity (12/26/2022)   Hunger Vital Sign    Worried About Running Out of Food in the Last Year: Never true    Ran Out of Food in the Last Year: Never true  Transportation Needs: No Transportation Needs (12/26/2022)   PRAPARE - Transportation    Lack of Transportation (Medical): No    Lack of Transportation (Non-Medical): No  Physical Activity: Not on file  Stress: Not on file  Social Connections: Not on file  Intimate Partner Violence: Not At Risk (12/26/2022)  Humiliation, Afraid, Rape, and Kick questionnaire    Fear of Current or Ex-Partner: No    Emotionally Abused: No    Physically Abused: No    Sexually Abused: No    Allergies  Allergen Reactions   Lisinopril      ?angioedema    Current Outpatient Medications  Medication Sig Dispense Refill   amLODipine  (NORVASC ) 10 MG tablet Take 1 tablet (10 mg total) by mouth daily. 90 tablet 1   chlorthalidone  (HYGROTON ) 25 MG tablet Take 25 mg by mouth daily.     pantoprazole  (PROTONIX ) 40 MG tablet Take 40 mg by mouth daily.     atorvastatin  (LIPITOR) 20 MG tablet Take 20 mg by mouth daily. (Patient not taking: Reported on 04/23/2024)     loperamide  (IMODIUM ) 2 MG capsule  Take 1 capsule (2 mg total) by mouth every 6 (six) hours as needed for diarrhea or loose stools. (Patient not taking: Reported on 04/23/2024) 30 capsule 0   meloxicam  (MOBIC ) 15 MG tablet Take 1 tablet (15 mg total) by mouth daily. (Patient not taking: Reported on 04/23/2024) 30 tablet 3   methocarbamol  (ROBAXIN ) 500 MG tablet Take 1 tablet (500 mg total) by mouth every 6 (six) hours as needed for muscle spasms. (Patient not taking: Reported on 04/23/2024) 20 tablet 0   No current facility-administered medications for this visit.    PHYSICAL EXAM Vitals:   04/23/24 1522 04/23/24 1525  BP: 135/85 133/83  Pulse: 80   SpO2: 98%   Weight: 230 lb (104.3 kg)   Height: 5' 2 (1.575 m)     Middle-age obese woman in no distress Regular rate and rhythm Unlabored breathing Normal gait and station No cranial nerve abnormalities No extremity weakness  PERTINENT LABORATORY AND RADIOLOGIC DATA  Most recent CBC    Latest Ref Rng & Units 12/26/2022   10:50 AM 12/25/2022    5:40 PM 01/19/2022   11:27 AM  CBC  WBC 4.0 - 10.5 K/uL 10.4  14.4  5.4   Hemoglobin 12.0 - 15.0 g/dL 86.4  84.6  86.8   Hematocrit 36.0 - 46.0 % 38.4  43.8  39.9   Platelets 150 - 400 K/uL 316  427  329      Most recent CMP    Latest Ref Rng & Units 12/30/2022    5:06 AM 12/29/2022    8:15 AM 12/28/2022    5:46 AM  CMP  Glucose 70 - 99 mg/dL 97  877  892   BUN 6 - 20 mg/dL 12  12  15    Creatinine 0.44 - 1.00 mg/dL 9.28  9.15  9.13   Sodium 135 - 145 mmol/L 132  132  130   Potassium 3.5 - 5.1 mmol/L 3.5  3.3  3.0   Chloride 98 - 111 mmol/L 99  96  98   CO2 22 - 32 mmol/L 25  26  26    Calcium  8.9 - 10.3 mg/dL 7.9  7.9  7.8     Renal function CrCl cannot be calculated (Patient's most recent lab result is older than the maximum 21 days allowed.).  HB A1C (BAYER DCA - WAIVED) (%)  Date Value  09/30/2016 5.5   Hgb A1c MFr Bld (%)  Date Value  01/19/2022 5.2    LDL Calculated  Date Value Ref Range Status   06/08/2018 145 (H) 0 - 99 mg/dL Final    Right Carotid: Velocities in the right ICA are consistent with a 1-39%  stenosis.  Non-hemodynamically significant plaque <50% noted in the  CCA. The                 ECA appears <50% stenosed.   Left Carotid: Velocities in the left ICA are consistent with a 40-59%  stenosis.               Non-hemodynamically significant plaque <50% noted in the  CCA. The                ECA appears <50% stenosed.   Vertebrals:  Bilateral vertebral arteries demonstrate antegrade flow.  Subclavians: Normal flow hemodynamics were seen in bilateral subclavian               arteries.   Debby SAILOR. Magda, MD [facility] Phone Number: [phone] 04/23/2024 4:16 PM   Total time spent on preparing this encounter including chart review, data review, collecting history, examining the patient, and coordinating care: 45 minutes  Portions of this report may have been transcribed using voice recognition software.  Every effort has been made to ensure accuracy; however, inadvertent computerized transcription errors may still be present.

## 2024-04-23 ENCOUNTER — Ambulatory Visit (INDEPENDENT_AMBULATORY_CARE_PROVIDER_SITE_OTHER): Admitting: Vascular Surgery

## 2024-04-23 ENCOUNTER — Encounter: Payer: Self-pay | Admitting: Vascular Surgery

## 2024-04-23 VITALS — BP 133/83 | HR 80 | Ht 62.0 in | Wt 230.0 lb

## 2024-04-23 DIAGNOSIS — I6523 Occlusion and stenosis of bilateral carotid arteries: Secondary | ICD-10-CM
